# Patient Record
Sex: Male | Born: 1965 | Race: Black or African American | Hispanic: No | Marital: Single | Smoking: Current every day smoker
Health system: Southern US, Community
[De-identification: ages and names within clinical notes are randomized; demographics above are authoritative.]

## PROBLEM LIST (undated history)

## (undated) DIAGNOSIS — I10 Essential (primary) hypertension: Secondary | ICD-10-CM

---

## 1999-09-12 ENCOUNTER — Encounter: Payer: Self-pay | Admitting: Emergency Medicine

## 1999-09-12 ENCOUNTER — Emergency Department (HOSPITAL_COMMUNITY): Admission: EM | Admit: 1999-09-12 | Discharge: 1999-09-12 | Payer: Self-pay | Admitting: Emergency Medicine

## 2001-02-20 ENCOUNTER — Inpatient Hospital Stay (HOSPITAL_COMMUNITY): Admission: EM | Admit: 2001-02-20 | Discharge: 2001-02-23 | Payer: Self-pay | Admitting: Emergency Medicine

## 2001-02-21 ENCOUNTER — Encounter: Payer: Self-pay | Admitting: Internal Medicine

## 2001-02-22 ENCOUNTER — Encounter: Payer: Self-pay | Admitting: Internal Medicine

## 2001-06-28 ENCOUNTER — Emergency Department (HOSPITAL_COMMUNITY): Admission: EM | Admit: 2001-06-28 | Discharge: 2001-06-28 | Payer: Self-pay | Admitting: Emergency Medicine

## 2001-07-19 ENCOUNTER — Emergency Department (HOSPITAL_COMMUNITY): Admission: EM | Admit: 2001-07-19 | Discharge: 2001-07-19 | Payer: Self-pay | Admitting: Emergency Medicine

## 2001-11-12 ENCOUNTER — Emergency Department (HOSPITAL_COMMUNITY): Admission: EM | Admit: 2001-11-12 | Discharge: 2001-11-12 | Payer: Self-pay | Admitting: Emergency Medicine

## 2002-03-06 ENCOUNTER — Encounter: Payer: Self-pay | Admitting: Emergency Medicine

## 2002-03-06 ENCOUNTER — Emergency Department (HOSPITAL_COMMUNITY): Admission: EM | Admit: 2002-03-06 | Discharge: 2002-03-06 | Payer: Self-pay | Admitting: Emergency Medicine

## 2002-03-07 ENCOUNTER — Emergency Department (HOSPITAL_COMMUNITY): Admission: EM | Admit: 2002-03-07 | Discharge: 2002-03-07 | Payer: Self-pay | Admitting: Emergency Medicine

## 2002-05-22 ENCOUNTER — Encounter: Admission: RE | Admit: 2002-05-22 | Discharge: 2002-08-20 | Payer: Self-pay | Admitting: Family Medicine

## 2002-09-23 ENCOUNTER — Emergency Department (HOSPITAL_COMMUNITY): Admission: EM | Admit: 2002-09-23 | Discharge: 2002-09-23 | Payer: Self-pay | Admitting: Emergency Medicine

## 2002-10-17 ENCOUNTER — Emergency Department (HOSPITAL_COMMUNITY): Admission: EM | Admit: 2002-10-17 | Discharge: 2002-10-17 | Payer: Self-pay | Admitting: Emergency Medicine

## 2003-03-03 ENCOUNTER — Emergency Department (HOSPITAL_COMMUNITY): Admission: EM | Admit: 2003-03-03 | Discharge: 2003-03-04 | Payer: Self-pay | Admitting: Emergency Medicine

## 2003-09-22 ENCOUNTER — Emergency Department (HOSPITAL_COMMUNITY): Admission: EM | Admit: 2003-09-22 | Discharge: 2003-09-23 | Payer: Self-pay | Admitting: Emergency Medicine

## 2003-10-09 ENCOUNTER — Encounter: Admission: RE | Admit: 2003-10-09 | Discharge: 2004-01-07 | Payer: Self-pay | Admitting: Family Medicine

## 2004-11-02 ENCOUNTER — Emergency Department (HOSPITAL_COMMUNITY): Admission: EM | Admit: 2004-11-02 | Discharge: 2004-11-02 | Payer: Self-pay | Admitting: Emergency Medicine
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
## 2005-10-12 ENCOUNTER — Emergency Department (HOSPITAL_COMMUNITY): Admission: EM | Admit: 2005-10-12 | Discharge: 2005-10-12 | Payer: Self-pay | Admitting: Emergency Medicine

## 2006-04-07 ENCOUNTER — Emergency Department (HOSPITAL_COMMUNITY): Admission: EM | Admit: 2006-04-07 | Discharge: 2006-04-07 | Payer: Self-pay | Admitting: Emergency Medicine

## 2006-05-13 ENCOUNTER — Emergency Department (HOSPITAL_COMMUNITY): Admission: EM | Admit: 2006-05-13 | Discharge: 2006-05-13 | Payer: Self-pay | Admitting: Emergency Medicine

## 2006-05-15 ENCOUNTER — Emergency Department (HOSPITAL_COMMUNITY): Admission: EM | Admit: 2006-05-15 | Discharge: 2006-05-15 | Payer: Self-pay | Admitting: Emergency Medicine

## 2006-05-17 ENCOUNTER — Emergency Department (HOSPITAL_COMMUNITY): Admission: EM | Admit: 2006-05-17 | Discharge: 2006-05-17 | Payer: Self-pay | Admitting: Family Medicine

## 2007-01-18 ENCOUNTER — Emergency Department (HOSPITAL_COMMUNITY): Admission: EM | Admit: 2007-01-18 | Discharge: 2007-01-18 | Payer: Self-pay | Admitting: Emergency Medicine

## 2007-02-19 ENCOUNTER — Emergency Department (HOSPITAL_COMMUNITY): Admission: EM | Admit: 2007-02-19 | Discharge: 2007-02-19 | Payer: Self-pay | Admitting: Emergency Medicine

## 2007-12-23 ENCOUNTER — Emergency Department (HOSPITAL_COMMUNITY): Admission: EM | Admit: 2007-12-23 | Discharge: 2007-12-23 | Payer: Self-pay | Admitting: Emergency Medicine

## 2008-03-18 ENCOUNTER — Emergency Department (HOSPITAL_COMMUNITY): Admission: EM | Admit: 2008-03-18 | Discharge: 2008-03-18 | Payer: Self-pay | Admitting: Family Medicine

## 2009-02-18 ENCOUNTER — Emergency Department (HOSPITAL_COMMUNITY): Admission: EM | Admit: 2009-02-18 | Discharge: 2009-02-18 | Payer: Self-pay | Admitting: Emergency Medicine

## 2010-06-09 ENCOUNTER — Inpatient Hospital Stay (INDEPENDENT_AMBULATORY_CARE_PROVIDER_SITE_OTHER)
Admission: RE | Admit: 2010-06-09 | Discharge: 2010-06-09 | Disposition: A | Discharge status: Home / Self Care | Payer: Self-pay | Source: Ambulatory Visit | Attending: Family Medicine | Admitting: Family Medicine

## 2010-06-09 DIAGNOSIS — L989 Disorder of the skin and subcutaneous tissue, unspecified: Secondary | ICD-10-CM

## 2010-07-22 NOTE — Discharge Summary (Signed)
[place]  Patient:    Nathan Benson, Nathan Benson Visit Number: [id number] MRN: [id number]          Service Type: MED Location: [id number] Attending Physician:  Phifer, Trinna Post Dictated by:   Ladell Pier, M.D. Admit Date:  02/20/2001 Discharge Date: 02/23/2001                             Discharge Summary  DISCHARGE DIAGNOSES: 1. Cocaine ingestion. 2. Metabolic acidosis, plus respiratory acidosis. 3. Seizure. 4. Cardiac arrhythmia.  DISCHARGE MEDICATIONS:  None.  FOLLOWUP APPOINTMENT:  The patient was told to call the outpatient clinic for appointment.  CONSULTANTS:  Critical care medicine.  PROCEDURES:  Intubated on the 18th, extubated on the 19th.  HISTORY/CHIEF COMPLAINT:  Cocaine ingestion per ER staff and police officer. The patient was initially taken into custody by police officers at 9 p.m. yesterday, February 20, 2001, for possession of controlled substances.  The patient apparently swallowed a bag of cocaine, was foaming at the mouth in the police car, was brought to the emergency room by the police.  He was alert and oriented at that time, stating that he wanted to get his stomach pumped.  He began feeling ill, per nursing staff.  The patient tried gagging himself for self-induced emesis.  At 2300, he began having status epilepticus.  Anesthesia was notified and the patient was sedated and intubated.  He went into ventricular tachycardia shock x1, with 360 joules, and converted to sinus. He is currently sedated and on the ventilator, loaded with Dilantin, and was given succinylcholine and lidocaine during induction.  On seeing the patient, blood pressure was 60-90/20s, heart rate 110 to 120, blood gas, at the time, pH was 6.75, bicarb 10.  PAST MEDICAL HISTORY:  None.  FAMILY HISTORY/SOCIAL HISTORY:  Unknown.  ALLERGIES:  No known drug allergies.  MEDICATIONS:  None.  PHYSICAL EXAMINATION:  VITAL SIGNS:  When seeing  the patient in the emergency room, temperature 98.7, blood pressure low at 70/20, currently it was 120/30s, heart rate 110 to 120s on the vent.  He was sedated and on the vent.  HEENT:  Pupils are equal, round, and reactive to light, 6 mm bilaterally. Oropharynx, blood-tinged.  RESPIRATORY:  Clear in the anterior fields.  CARDIOVASCULAR:  He was tachycardic.  ABDOMEN:  Positive bowel sounds.  NEURO:  Toes were downgoing.  EXTREMITIES:  Lower extremity reflexes were about 1+.  HOSPITAL COURSE: #1 - COCAINE OVERDOSE:  Poison control was contacted and stated that cocaine clears the system in 8-12 hours.  The main concern was the seizure, the decreased blood pressure, and the respiratory status.  Currently, the patient was hemodynamically improved on D5 half-normal saline and bicarb with potassium.  He was loaded with Dilantin for status and he was kept sedated on the vent, due to respiratory depression.  The patient did well on the vent, woke up and was able to follow commands.  The next day, head CT was negative for anything acute.  The patient recovered full mental status and was tried on a spontaneous breathing trial on the vent and was successful, was successfully extubated and did well post extubation without any complications.  #2 - METABOLIC ACIDOSIS/RESPIRATORY ACIDOSIS:  On admission, pH was 6.75.  The patient was infused two amps of bicarb and placed on a bicarb drip.  Blood gas went to pH 7.55, PCO2 27, with PO2 of 575.  Bicarb  of 24 and that was on the vent.  During the course of his stay, his metabolic acidosis and respiratory acidosis corrected.  #3 - SEIZURE:  In the emergency department, he was loaded with Dilantin.  He had a head CT which showed no hemorrhage.  Head CT was negative.  His _____, he had no recurrence of seizure after the episode in the emergency department. The seizure was most likely secondary to the cocaine.  He required no more treatments for  seizure.  #4 - CARDIAC ARRHYTHMIA:  The patient went into ventricular tachycardia requiring defibrillator and was given lidocaine and put on a lidocaine drip. Cardiac enzymes were done which were negative, except for elevated CKs, possible mild rhabdomyolysis from the seizure and from the shock.  During his hospital course, his CK levels increased all the way up to 13,315 with his MB 143, relative index of 1.1, and his troponin 1.19.  The elevation in enzymes is most likely secondary to the patient requiring defibrillation for his ventricular tachycardia and probably some rhabdomyolysis.  His enzymes started trending down during his hospitalization.  #5 - ELEVATED LIVER ENZYMES:  The patients liver enzymes were also elevated during his hospitalization.  That was most likely secondary to his hypertension, probably from shocked liver.  His liver enzymes also trended down and the patient was to follow up outpatient to have his liver enzymes rechecked.  #6 - HYPOKALEMIA:  The patient was treated with runs of potassium for his hypokalemia, and that resolved.  Overall, the patient was back to his baseline on discharge and was told to follow up with the outpatient clinic to get all of his laboratory work checked within a week to see if everything had gone back to baseline.  LABORATORIES:  On discharge:  Head CT negative.  Chest x-ray negative. Abdominal CT showed normal bowel gas pattern, no foreign body identified. Chest x-ray showed no active disease.  Comprehensive metabolic panel:  Sodium 141, potassium 3.6, chloride 108, CO2 30, glucose 115, BUN 5, creatinine 1.2, calcium 8.9.  Total protein 6.6, albumin 3.4, AST 296, ALT 146, alkaline phosphatase 53, total bilirubin 0.7.  Cardiac enzymes were trended down, 15,509, CK MB 64.7, troponin 0.52.  Maximum CK was 20,287, MB 153.7, and troponin 0.72.  UA was negative.  Urine drug screen positive for cocaine. Dictated by:   Ladell Pier,  M.D. Attending Physician:  Phifer, Trinna Post DD:  05/24/01  TD:  05/27/01 Job: [id number]

## 2011-03-17 ENCOUNTER — Encounter (HOSPITAL_COMMUNITY): Payer: Self-pay

## 2011-03-17 ENCOUNTER — Emergency Department (INDEPENDENT_AMBULATORY_CARE_PROVIDER_SITE_OTHER)
Admission: EM | Admit: 2011-03-17 | Discharge: 2011-03-17 | Disposition: A | Discharge status: Home / Self Care | Payer: Self-pay | Source: Home / Self Care | Attending: Family Medicine | Admitting: Family Medicine

## 2011-03-17 DIAGNOSIS — L230 Allergic contact dermatitis due to metals: Secondary | ICD-10-CM

## 2011-03-17 MED ORDER — TRIAMCINOLONE ACETONIDE 0.1 % EX OINT: TOPICAL_OINTMENT | Freq: Two times a day (BID) | CUTANEOUS | Status: AC

## 2011-03-17 NOTE — ED Notes (Signed)
C/o his belt makes him break out in rash; also has recurrence of rash on shoulder x 2 yr

## 2011-04-18 NOTE — ED Provider Notes (Addendum)
History     CSN: [id number]  Arrival date & time 03/17/11  1338   First MD Initiated Contact with Patient 03/17/11 1355      Chief Complaint  Patient presents with  . Rash    (Consider location/radiation/quality/duration/timing/severity/associated sxs/prior treatment) Patient is a 46 y.o. male presenting with rash. The history is provided by the patient.  Rash  This is a recurrent problem. The problem has not changed since onset.The problem is associated with nothing. There has been no fever. The rash is present on the abdomen. The pain is mild. Associated symptoms include itching.    History reviewed. No pertinent past medical history.  History reviewed. No pertinent past surgical history.  History reviewed. No pertinent family history.  History  Substance Use Topics  . Smoking status: Current Everyday Smoker -- 0.5 packs/day  . Smokeless tobacco: Not on file  . Alcohol Use: No      Review of Systems  Constitutional: Negative.   HENT: Negative.   Eyes: Negative.   Respiratory: Negative.   Cardiovascular: Negative.   Gastrointestinal: Negative.   Genitourinary: Negative.   Musculoskeletal: Negative.   Skin: Positive for itching and rash.  Neurological: Negative.     Allergies  Review of patient's allergies indicates no known allergies.  Home Medications   Current Outpatient Rx  Name Route Sig Dispense Refill  . TRIAMCINOLONE ACETONIDE 0.1 % EX OINT Topical Apply topically 2 (two) times daily. 80 g 1    BP 158/89  Pulse 102  Temp(Src) 99.4 F (37.4 C) (Oral)  Resp 18  SpO2 97%  Physical Exam  Nursing note and vitals reviewed. Constitutional: He is oriented to person, place, and time. He appears well-developed and well-nourished.  HENT:  Head: Normocephalic and atraumatic.  Eyes: EOM are normal.  Neck: Normal range of motion.  Pulmonary/Chest: Effort normal.  Musculoskeletal: Normal range of motion.  Neurological: He is alert and oriented to  person, place, and time.  Skin: Skin is warm and dry. Rash noted. Rash is papular.     Psychiatric: His behavior is normal.    ED Course  Procedures (including critical care time)  Labs Reviewed - No data to display No results found.   1. 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

## 2012-02-23 ENCOUNTER — Emergency Department (INDEPENDENT_AMBULATORY_CARE_PROVIDER_SITE_OTHER)
Admission: EM | Admit: 2012-02-23 | Discharge: 2012-02-23 | Disposition: A | Discharge status: Home / Self Care | Payer: Self-pay | Source: Home / Self Care | Attending: Family Medicine | Admitting: Family Medicine

## 2012-02-23 ENCOUNTER — Encounter (HOSPITAL_COMMUNITY): Payer: Self-pay | Admitting: Emergency Medicine

## 2012-02-23 DIAGNOSIS — L230 Allergic contact dermatitis due to metals: Secondary | ICD-10-CM

## 2012-02-23 MED ORDER — FLUTICASONE PROPIONATE 0.005 % EX OINT: TOPICAL_OINTMENT | Freq: Two times a day (BID) | CUTANEOUS | Status: DC

## 2012-02-23 NOTE — ED Provider Notes (Signed)
History     CSN: [id number]  Arrival date & time 02/23/12  1145   First MD Initiated Contact with Patient 02/23/12 1202      Chief Complaint  Patient presents with  . Rash    (Consider location/radiation/quality/duration/timing/severity/associated sxs/prior treatment) Patient is a 46 y.o. male presenting with rash. The history is provided by the patient.  Rash  This is a chronic problem. The current episode started more than 1 week ago (seen 1 yr ago , given cr but not resolved.). The problem has not changed since onset.The problem is associated with an unknown factor. There has been no fever.    History reviewed. No pertinent past medical history.  History reviewed. No pertinent past surgical history.  History reviewed. No pertinent family history.  History  Substance Use Topics  . Smoking status: Current Every Day Smoker -- 0.5 packs/day  . Smokeless tobacco: Not on file  . Alcohol Use: No      Review of Systems  Constitutional: Negative.   Skin: Positive for rash.    Allergies  Review of patient's allergies indicates no known allergies.  Home Medications   Current Outpatient Rx  Name  Route  Sig  Dispense  Refill  . FLUTICASONE PROPIONATE 0.005 % EX OINT   Topical   Apply topically 2 (two) times daily. To area of rash   30 g   1   . TRIAMCINOLONE ACETONIDE 0.1 % EX OINT   Topical   Apply topically 2 (two) times daily.   80 g   1     BP 151/92  Pulse 97  Temp 98.5 F (36.9 C) (Oral)  Resp 16  SpO2 100%  Physical Exam  Nursing note and vitals reviewed. Constitutional: He is oriented to person, place, and time. He appears well-developed and well-nourished.  Neurological: He is alert and oriented to person, place, and time.  Skin: Skin is warm and dry. Rash noted.       Hyperpigmented infraumbilical dermatitis , nontender, no infection, dry.  Psychiatric: He has a normal mood and affect.    ED Course  Procedures (including critical care  time)  Labs Reviewed - No data to display No results found.   1. Contact dermatitis due to metal       MDM          Linna Hoff, MD 02/23/12 [phone]

## 2012-02-23 NOTE — ED Notes (Addendum)
Reports skin rash on abdominal area.  Patient states he feels like his belt is breaking him out.  Reports using triam. Cream but ran out.  Cream does help.

## 2012-10-31 ENCOUNTER — Emergency Department (INDEPENDENT_AMBULATORY_CARE_PROVIDER_SITE_OTHER)
Admission: EM | Admit: 2012-10-31 | Discharge: 2012-10-31 | Disposition: A | Discharge status: Home / Self Care | Payer: Self-pay | Source: Home / Self Care | Attending: Family Medicine | Admitting: Family Medicine

## 2012-10-31 ENCOUNTER — Encounter (HOSPITAL_COMMUNITY): Payer: Self-pay | Admitting: *Deleted

## 2012-10-31 DIAGNOSIS — I10 Essential (primary) hypertension: Secondary | ICD-10-CM

## 2012-10-31 DIAGNOSIS — L259 Unspecified contact dermatitis, unspecified cause: Secondary | ICD-10-CM

## 2012-10-31 LAB — POCT I-STAT, CHEM 8
Calcium, Ion: 1.22 mmol/L (ref 1.12–1.23)
Glucose, Bld: 92 mg/dL (ref 70–99)
HCT: 49 % (ref 39.0–52.0)
Hemoglobin: 16.7 g/dL (ref 13.0–17.0)

## 2012-10-31 MED ORDER — TRIAMCINOLONE ACETONIDE 0.1 % EX CREA: TOPICAL_CREAM | Freq: Two times a day (BID) | CUTANEOUS | Status: DC

## 2012-10-31 MED ORDER — ATENOLOL-CHLORTHALIDONE 50-25 MG PO TABS: 1.0000 | ORAL_TABLET | Freq: Every day | ORAL | Status: DC

## 2012-10-31 MED ORDER — TRIAMCINOLONE ACETONIDE 40 MG/ML IJ SUSP
INTRAMUSCULAR | Status: AC
  Filled 2012-10-31: qty 1

## 2012-10-31 MED ORDER — METHYLPREDNISOLONE ACETATE 80 MG/ML IJ SUSP
INTRAMUSCULAR | Status: AC
  Filled 2012-10-31: qty 1

## 2012-10-31 MED ORDER — TRIAMCINOLONE ACETONIDE 40 MG/ML IJ SUSP
40.0000 mg | Freq: Once | INTRAMUSCULAR | Status: AC
  Administered 2012-10-31: 40 mg via INTRAMUSCULAR

## 2012-10-31 MED ORDER — METHYLPREDNISOLONE ACETATE 40 MG/ML IJ SUSP
80.0000 mg | Freq: Once | INTRAMUSCULAR | Status: AC
  Administered 2012-10-31: 80 mg via INTRAMUSCULAR

## 2012-10-31 NOTE — Discharge Instructions (Signed)
Take medicine as prescribed and see doctor in 2 weeks for recheck.

## 2012-10-31 NOTE — ED Notes (Signed)
Pt c/o rash covering upper extremities, chest, and back x 1 week. Pt stated he applied calamine lotion with no relief. 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 Jan Ranson, SMA

## 2012-10-31 NOTE — ED Provider Notes (Signed)
CSN: [id number]     Arrival date & time 10/31/12  1518 History   First MD Initiated Contact with Patient 10/31/12 1616     Chief Complaint  Patient presents with  . Rash   (Consider location/radiation/quality/duration/timing/severity/associated sxs/prior Treatment) Patient is a 47 y.o. male presenting with rash. The history is provided by the patient.  Rash Pain severity:  No pain Onset quality:  Gradual Duration:  1 week Progression:  Unchanged Chronicity:  New Context: not sick contacts and not suspicious food intake   Context comment:  Cuts grass on job Relieved by:  Nothing Associated symptoms: no chest pain     History reviewed. No pertinent past medical history. History reviewed. No pertinent past surgical history. No family history on file. History  Substance Use Topics  . Smoking status: Current Every Day Smoker -- 0.50 packs/day  . Smokeless tobacco: Not on file  . Alcohol Use: No    Review of Systems  Constitutional: Negative.   Respiratory: Negative.   Cardiovascular: Negative for chest pain.  Skin: Positive for rash.    Allergies  Review of patient's allergies indicates no known allergies.  Home Medications   Current Outpatient Rx  Name  Route  Sig  Dispense  Refill  . atenolol-chlorthalidone (TENORETIC) 50-25 MG per tablet   Oral   Take 1 tablet by mouth daily.   30 tablet   1   . fluticasone (CUTIVATE) 0.005 % ointment   Topical   Apply topically 2 (two) times daily. To area of rash   30 g   1    BP 170/121  Pulse 107  Temp(Src) 98.6 F (37 C) (Oral)  Resp 20  SpO2 100% Physical Exam  Nursing note and vitals reviewed. Constitutional: He is oriented to person, place, and time. He appears well-developed and well-nourished.  Neck: Normal range of motion. Neck supple.  Cardiovascular: Normal heart sounds and intact distal pulses.  Tachycardia present.   Pulmonary/Chest: Effort normal and breath sounds normal.  Neurological: He is alert  and oriented to person, place, and time.  Skin: Skin is warm and dry. Rash noted.  Diffuse papular rash, sparing palms and soles.    ED Course  Procedures (including critical care time) Labs Review Labs Reviewed  POCT I-STAT, CHEM 8 - Abnormal; Notable for the following:    Creatinine, Ser 1.40 (*)    All other components within normal limits   Imaging Review No results found.  MDM   1. Hypertension   2. Contact dermatitis        Linna Hoff, MD 10/31/12 [phone]

## 2016-01-05 ENCOUNTER — Other Ambulatory Visit: Payer: Self-pay | Admitting: Occupational Medicine

## 2016-01-05 ENCOUNTER — Ambulatory Visit: Payer: Self-pay

## 2016-01-05 DIAGNOSIS — M79644 Pain in right finger(s): Secondary | ICD-10-CM

## 2016-02-03 DIAGNOSIS — M20011 Mallet finger of right finger(s): Secondary | ICD-10-CM | POA: Insufficient documentation

## 2016-03-17 DIAGNOSIS — M25649 Stiffness of unspecified hand, not elsewhere classified: Secondary | ICD-10-CM | POA: Insufficient documentation

## 2018-03-21 ENCOUNTER — Emergency Department (HOSPITAL_COMMUNITY)
Admission: EM | Admit: 2018-03-21 | Discharge: 2018-03-22 | Disposition: A | Discharge status: Home / Self Care | Payer: 59 | Attending: Emergency Medicine | Admitting: Emergency Medicine

## 2018-03-21 ENCOUNTER — Encounter (HOSPITAL_COMMUNITY): Payer: Self-pay

## 2018-03-21 ENCOUNTER — Other Ambulatory Visit: Payer: Self-pay

## 2018-03-21 DIAGNOSIS — W2209XA Striking against other stationary object, initial encounter: Secondary | ICD-10-CM | POA: Diagnosis not present

## 2018-03-21 DIAGNOSIS — S0501XA Injury of conjunctiva and corneal abrasion without foreign body, right eye, initial encounter: Secondary | ICD-10-CM | POA: Insufficient documentation

## 2018-03-21 DIAGNOSIS — F17210 Nicotine dependence, cigarettes, uncomplicated: Secondary | ICD-10-CM | POA: Insufficient documentation

## 2018-03-21 DIAGNOSIS — Y929 Unspecified place or not applicable: Secondary | ICD-10-CM | POA: Diagnosis not present

## 2018-03-21 DIAGNOSIS — Y939 Activity, unspecified: Secondary | ICD-10-CM | POA: Insufficient documentation

## 2018-03-21 DIAGNOSIS — I10 Essential (primary) hypertension: Secondary | ICD-10-CM | POA: Diagnosis not present

## 2018-03-21 DIAGNOSIS — Y999 Unspecified external cause status: Secondary | ICD-10-CM | POA: Insufficient documentation

## 2018-03-21 DIAGNOSIS — Z79899 Other long term (current) drug therapy: Secondary | ICD-10-CM | POA: Diagnosis not present

## 2018-03-21 NOTE — ED Triage Notes (Signed)
Pt here for complaints of a splinter in his Right eye.  Pt states he was at work and felt something in his eye.  Went home and thought it was fine, now pain got worse.  Eye red on exam.

## 2018-03-22 MED ORDER — TETRACAINE HCL 0.5 % OP SOLN
2.0000 [drp] | Freq: Once | OPHTHALMIC | Status: AC
  Administered 2018-03-22: 2 [drp] via OPHTHALMIC
  Filled 2018-03-22: qty 4

## 2018-03-22 MED ORDER — ATENOLOL-CHLORTHALIDONE 50-25 MG PO TABS: 1.0000 | ORAL_TABLET | Freq: Every day | ORAL | 0 refills | Status: DC

## 2018-03-22 MED ORDER — FLUORESCEIN SODIUM 1 MG OP STRP
1.0000 | ORAL_STRIP | Freq: Once | OPHTHALMIC | Status: DC
  Filled 2018-03-22: qty 1

## 2018-03-22 MED ORDER — ERYTHROMYCIN 5 MG/GM OP OINT: TOPICAL_OINTMENT | OPHTHALMIC | 1 refills | Status: DC

## 2018-03-22 MED ORDER — ERYTHROMYCIN 5 MG/GM OP OINT
1.0000 "application " | TOPICAL_OINTMENT | Freq: Once | OPHTHALMIC | Status: AC
  Administered 2018-03-22: 1 via OPHTHALMIC
  Filled 2018-03-22: qty 3.5

## 2018-03-22 MED ORDER — OXYCODONE-ACETAMINOPHEN 5-325 MG PO TABS: 1.0000 | ORAL_TABLET | ORAL | 0 refills | Status: DC | PRN
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
## 2018-03-22 NOTE — ED Notes (Addendum)
Pt has approached this EMT multiple times in the lobby about the pain in his eye stating "it feels like there's a hole burning in my eye." This EMT attempted on multiple occasions to examine eye but pt refused to let staff touch or get near eye area. Noted to be red and watery but nothing else able to be observed.   Pt noted to he unsteady on his feet and encouraged multiple times to sit down for his own safety. Arlys John, RN made aware that pt was non-complaint.

## 2019-05-26 ENCOUNTER — Encounter (HOSPITAL_COMMUNITY): Payer: Self-pay | Admitting: Emergency Medicine

## 2019-05-26 ENCOUNTER — Other Ambulatory Visit: Payer: Self-pay

## 2019-05-26 ENCOUNTER — Ambulatory Visit (HOSPITAL_COMMUNITY)
Admission: EM | Admit: 2019-05-26 | Discharge: 2019-05-26 | Disposition: A | Discharge status: Home / Self Care | Payer: 59 | Attending: Family Medicine | Admitting: Family Medicine

## 2019-05-26 DIAGNOSIS — I10 Essential (primary) hypertension: Secondary | ICD-10-CM

## 2019-05-26 DIAGNOSIS — S39012A Strain of muscle, fascia and tendon of lower back, initial encounter: Secondary | ICD-10-CM | POA: Diagnosis not present

## 2019-05-26 HISTORY — DX: Essential (primary) hypertension: I10

## 2019-05-26 MED ORDER — CYCLOBENZAPRINE HCL 10 MG PO TABS: ORAL_TABLET | ORAL | 0 refills | Status: DC

## 2019-05-26 MED ORDER — ATENOLOL-CHLORTHALIDONE 50-25 MG PO TABS: 1.0000 | ORAL_TABLET | Freq: Every day | ORAL | 2 refills | Status: DC

## 2019-05-26 MED ORDER — DICLOFENAC SODIUM 75 MG PO TBEC: 75.0000 mg | DELAYED_RELEASE_TABLET | Freq: Two times a day (BID) | ORAL | 0 refills | Status: DC
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
## 2019-05-31 ENCOUNTER — Emergency Department (HOSPITAL_COMMUNITY)
Admission: EM | Admit: 2019-05-31 | Discharge: 2019-05-31 | Disposition: A | Discharge status: Home / Self Care | Payer: 59 | Attending: Emergency Medicine | Admitting: Emergency Medicine

## 2019-05-31 ENCOUNTER — Other Ambulatory Visit: Payer: Self-pay

## 2019-05-31 ENCOUNTER — Emergency Department (HOSPITAL_COMMUNITY): Payer: 59

## 2019-05-31 ENCOUNTER — Encounter (HOSPITAL_COMMUNITY): Payer: Self-pay | Admitting: Emergency Medicine

## 2019-05-31 DIAGNOSIS — I10 Essential (primary) hypertension: Secondary | ICD-10-CM | POA: Diagnosis not present

## 2019-05-31 DIAGNOSIS — Z79899 Other long term (current) drug therapy: Secondary | ICD-10-CM | POA: Diagnosis not present

## 2019-05-31 DIAGNOSIS — S46811A Strain of other muscles, fascia and tendons at shoulder and upper arm level, right arm, initial encounter: Secondary | ICD-10-CM

## 2019-05-31 DIAGNOSIS — Y999 Unspecified external cause status: Secondary | ICD-10-CM | POA: Insufficient documentation

## 2019-05-31 DIAGNOSIS — S63696A Other sprain of right little finger, initial encounter: Secondary | ICD-10-CM

## 2019-05-31 DIAGNOSIS — Y92410 Unspecified street and highway as the place of occurrence of the external cause: Secondary | ICD-10-CM | POA: Diagnosis not present

## 2019-05-31 DIAGNOSIS — M545 Low back pain, unspecified: Secondary | ICD-10-CM

## 2019-05-31 DIAGNOSIS — F17200 Nicotine dependence, unspecified, uncomplicated: Secondary | ICD-10-CM | POA: Insufficient documentation

## 2019-05-31 DIAGNOSIS — M549 Dorsalgia, unspecified: Secondary | ICD-10-CM | POA: Diagnosis not present

## 2019-05-31 DIAGNOSIS — S4991XA Unspecified injury of right shoulder and upper arm, initial encounter: Secondary | ICD-10-CM | POA: Diagnosis present

## 2019-05-31 DIAGNOSIS — Y9389 Activity, other specified: Secondary | ICD-10-CM | POA: Diagnosis not present
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
## 2019-05-31 NOTE — Discharge Instructions (Addendum)
Wear finger splint for to help with pain in the little finger Continue Ibuprofen as needed for pain for the next week. Take this medicine with food. Take muscle relaxer at bedtime to help you sleep. This medicine makes you drowsy so do not take before driving or work Use a heating pad for sore muscles - use for 20 minutes several times a day Try gentle range of motion exercises for the back, neck, and shoulder Return for worsening symptoms

## 2019-08-20 ENCOUNTER — Ambulatory Visit (HOSPITAL_COMMUNITY)
Admission: EM | Admit: 2019-08-20 | Discharge: 2019-08-20 | Disposition: A | Discharge status: Home / Self Care | Payer: 59 | Attending: Urgent Care | Admitting: Urgent Care

## 2019-08-20 ENCOUNTER — Other Ambulatory Visit: Payer: Self-pay

## 2019-08-20 ENCOUNTER — Encounter (HOSPITAL_COMMUNITY): Payer: Self-pay | Admitting: Emergency Medicine

## 2019-08-20 DIAGNOSIS — Z760 Encounter for issue of repeat prescription: Secondary | ICD-10-CM | POA: Diagnosis not present

## 2019-08-20 DIAGNOSIS — I10 Essential (primary) hypertension: Secondary | ICD-10-CM

## 2019-08-20 MED ORDER — ATENOLOL-CHLORTHALIDONE 50-25 MG PO TABS: 1.0000 | ORAL_TABLET | Freq: Every day | ORAL | 0 refills | Status: DC

## 2019-08-20 NOTE — ED Provider Notes (Signed)
  [facility]   MRN: [id number] DOB: 10-20-1965  Subjective:   Nathan Benson is a 54 y.o. male presenting for medication refill of his blood pressure medicine atenolol chlorthalidone.  Patient states that he is planning on establishing care with a new PCP.  Denies headache, confusion, dizziness, chest pain, hematuria.  No current facility-administered medications for this encounter.  Current Outpatient Medications:  .  atenolol-chlorthalidone (TENORETIC) 50-25 MG tablet, Take 1 tablet by mouth daily., Disp: 30 tablet, Rfl: 2 .  cyclobenzaprine (FLEXERIL) 10 MG tablet, Take 1 tablet by mouth 3 times daily as needed for muscle spasm. Warning: May cause drowsiness., Disp: 21 tablet, Rfl: 0 .  diclofenac (VOLTAREN) 75 MG EC tablet, Take 1 tablet (75 mg total) by mouth 2 (two) times daily., Disp: 14 tablet, Rfl: 0   No Known Allergies  Past Medical History:  Diagnosis Date  . Hypertension      History reviewed. No pertinent surgical history.  History reviewed. No pertinent family history.  Social History   Tobacco Use  . Smoking status: Current Every Day Smoker    Packs/day: 0.50  . Smokeless tobacco: Never Used  Vaping Use  . Vaping Use: Never used  Substance Use Topics  . Alcohol use: No  . Drug use: No    ROS   Objective:   Vitals: BP (!) 139/94 (BP Location: Left Arm)   Pulse 84   Temp 98 F (36.7 C)   Resp 20   SpO2 97%   Physical Exam Constitutional:      General: He is not in acute distress.    Appearance: Normal appearance. He is well-developed. He is not ill-appearing, toxic-appearing or diaphoretic.  HENT:     Head: Normocephalic and atraumatic.     Right Ear: External ear normal.     Left Ear: External ear normal.     Nose: Nose normal.     Mouth/Throat:     Mouth: Mucous membranes are moist.     Pharynx: Oropharynx is clear.  Eyes:     General: No scleral icterus.    Extraocular Movements: Extraocular movements intact.     Pupils:  Pupils are equal, round, and reactive to light.  Cardiovascular:     Rate and Rhythm: Normal rate and regular rhythm.     Heart sounds: Normal heart sounds. No murmur heard.  No friction rub. No gallop.   Pulmonary:     Effort: Pulmonary effort is normal. No respiratory distress.     Breath sounds: Normal breath sounds. No stridor. No wheezing, rhonchi or rales.  Neurological:     Mental Status: He is alert and oriented to person, place, and time.  Psychiatric:        Mood and Affect: Mood normal.        Behavior: Behavior normal.        Thought Content: Thought content normal.        Judgment: Judgment normal.      Assessment and Plan :   PDMP not reviewed this encounter.  1. Encounter for medication refill   2. Essential hypertension     Patient states he will see his new PCP in September.  Provided 90-day supply. Counseled patient on potential for adverse effects with medications prescribed/recommended today, ER and return-to-clinic precautions discussed, patient verbalized understanding.    Wallis Bamberg, [place] 08/20/19 1919

## 2019-08-20 NOTE — ED Triage Notes (Signed)
Patient ran out of blood pressure medicine yesterday.  Says he does not pcp.  Patient says last script was written here.  Patient denies pain

## 2019-08-20 NOTE — Discharge Instructions (Signed)

## 2019-11-08 ENCOUNTER — Ambulatory Visit (HOSPITAL_COMMUNITY)
Admission: EM | Admit: 2019-11-08 | Discharge: 2019-11-08 | Disposition: A | Discharge status: Home / Self Care | Payer: 59 | Attending: Family Medicine | Admitting: Family Medicine

## 2019-11-08 ENCOUNTER — Other Ambulatory Visit: Payer: Self-pay

## 2019-11-08 ENCOUNTER — Encounter (HOSPITAL_COMMUNITY): Payer: Self-pay | Admitting: Gynecology

## 2019-11-08 DIAGNOSIS — Z760 Encounter for issue of repeat prescription: Secondary | ICD-10-CM | POA: Diagnosis not present

## 2019-11-08 DIAGNOSIS — I10 Essential (primary) hypertension: Secondary | ICD-10-CM | POA: Diagnosis not present

## 2019-11-08 MED ORDER — ATENOLOL-CHLORTHALIDONE 50-25 MG PO TABS: 1.0000 | ORAL_TABLET | Freq: Every day | ORAL | 0 refills | Status: DC

## 2019-11-08 NOTE — ED Triage Notes (Signed)
Patient need his Atenolol medication refill.

## 2019-11-08 NOTE — ED Provider Notes (Signed)
[facility]    CSN: [id number] Arrival date & time: 11/08/19  1057      History   Chief Complaint Chief Complaint  Patient presents with   Medication Refill    HPI Nathan Benson is a 54 y.o. male.   Reports that he needs a refill of his blood pressure medications. Takes atenolol-chlorthalidone 50-25mg  daily. Reports that he does not have primary care. Declines headaches, sore throat, nausea, vomiting, diarrhea, chest pain, rash, fever, other symptoms.   ROS per HPI     Past Medical History:  Diagnosis Date   Hypertension     There are no problems to display for this patient.   History reviewed. No pertinent surgical history.     Home Medications    Prior to Admission medications   Medication Sig Start Date End Date Taking? Authorizing Provider  atenolol-chlorthalidone (TENORETIC) 50-25 MG tablet Take 1 tablet by mouth daily. 11/08/19   Moshe Cipro, NP    Family History History reviewed. No pertinent family history.  Social History Social History   Tobacco Use   Smoking status: Current Every Day Smoker    Packs/day: 0.50   Smokeless tobacco: Never Used  Vaping Use   Vaping Use: Never used  Substance Use Topics   Alcohol use: No   Drug use: No     Allergies   Patient has no known allergies.   Review of Systems Review of Systems   Physical Exam Triage Vital Signs ED Triage Vitals  Enc Vitals Group     BP 11/08/19 1304 (!) 158/109     Pulse Rate 11/08/19 1304 73     Resp 11/08/19 1304 16     Temp 11/08/19 1304 98.8 F (37.1 C)     Temp Source 11/08/19 1304 Oral     SpO2 11/08/19 1304 98 %     Weight 11/08/19 1303 145 lb (65.8 kg)     Height 11/08/19 1303 5\' 4"  (1.626 m)     Head Circumference --      Peak Flow --      Pain Score --      Pain Loc --      Pain Edu? --      Excl. in GC? --    No data found.  Updated Vital Signs BP (!) 158/109 (BP Location: Left Arm)    Pulse 73    Temp 98.8 F (37.1 C)  (Oral)    Resp 16    Ht 5\' 4"  (1.626 m)    Wt 145 lb (65.8 kg)    SpO2 98%    BMI 24.89 kg/m   Visual Acuity Right Eye Distance:   Left Eye Distance:   Bilateral Distance:    Right Eye Near:   Left Eye Near:    Bilateral Near:     Physical Exam Vitals and nursing note reviewed.  Constitutional:      Appearance: Normal appearance. He is well-developed.  HENT:     Head: Normocephalic and atraumatic.  Eyes:     Extraocular Movements: Extraocular movements intact.     Conjunctiva/sclera: Conjunctivae normal.     Pupils: Pupils are equal, round, and reactive to light.  Cardiovascular:     Rate and Rhythm: Normal rate and regular rhythm.     Heart sounds: Normal heart sounds. No murmur heard.   Pulmonary:     Effort: Pulmonary effort is normal. No respiratory distress.     Breath sounds: Normal breath sounds. No stridor.  No wheezing, rhonchi or rales.  Chest:     Chest wall: No tenderness.  Abdominal:     Palpations: Abdomen is soft.     Tenderness: There is no abdominal tenderness.  Musculoskeletal:     Cervical back: Neck supple.  Skin:    General: Skin is warm and dry.     Capillary Refill: Capillary refill takes less than 2 seconds.  Neurological:     General: No focal deficit present.     Mental Status: He is alert and oriented to person, place, and time.  Psychiatric:        Mood and Affect: Mood normal.        Behavior: Behavior normal.        Thought Content: Thought content normal.      UC Treatments / Results  Labs (all labs ordered are listed, but only abnormal results are displayed) Labs Reviewed - No data to display  EKG   Radiology No results found.  Procedures Procedures (including critical care time)  Medications Ordered in UC Medications - No data to display  Initial Impression / Assessment and Plan / UC Course  I have reviewed the triage vital signs and the nursing notes.  Pertinent labs & imaging results that were available during my  care of the patient were reviewed by me and considered in my medical decision making (see chart for details).     Medication Refill  Hypertension  Discussed with patient the need to establish primary care States that he is in the process of doing this since he has insurance Refilled atenolol-chlorthalidone 50-25 Follow up with primary care  Final Clinical Impressions(s) / UC Diagnoses   Final diagnoses:  Medication refill  Essential hypertension     Discharge Instructions     I have refilled your blood pressure medication  Follow up with primary care for future refills    ED Prescriptions    Medication Sig Dispense Auth. Provider   atenolol-chlorthalidone (TENORETIC) 50-25 MG tablet Take 1 tablet by mouth daily. 90 tablet Moshe Cipro, NP     PDMP not reviewed this encounter.   Moshe Cipro, NP 11/08/19 1320

## 2019-11-08 NOTE — Discharge Instructions (Addendum)
I have refilled your blood pressure medication  Follow up with primary care for future refills

## 2019-12-03 ENCOUNTER — Telehealth (HOSPITAL_COMMUNITY): Payer: Self-pay

## 2019-12-03 MED ORDER — ATENOLOL-CHLORTHALIDONE 50-25 MG PO TABS: 1.0000 | ORAL_TABLET | Freq: Every day | ORAL | 0 refills | Status: DC

## 2020-02-24 ENCOUNTER — Ambulatory Visit (HOSPITAL_COMMUNITY)
Admission: EM | Admit: 2020-02-24 | Discharge: 2020-02-24 | Disposition: A | Discharge status: Home / Self Care | Payer: 59 | Attending: Urgent Care | Admitting: Urgent Care

## 2020-02-24 ENCOUNTER — Encounter (HOSPITAL_COMMUNITY): Payer: Self-pay

## 2020-02-24 DIAGNOSIS — I10 Essential (primary) hypertension: Secondary | ICD-10-CM

## 2020-02-24 DIAGNOSIS — R030 Elevated blood-pressure reading, without diagnosis of hypertension: Secondary | ICD-10-CM

## 2020-02-24 MED ORDER — ATENOLOL-CHLORTHALIDONE 50-25 MG PO TABS: 1.0000 | ORAL_TABLET | Freq: Every day | ORAL | 0 refills | Status: DC

## 2020-02-24 NOTE — ED Provider Notes (Signed)
[facility]   MRN: [id number] DOB: 1965/04/13  Subjective:   Nathan Benson is a 54 y.o. male presenting for medication refill.  Patient states that his blood pressure has stayed high.  Admits that he does not eat healthily, eats a lot of fast food.  Does not have a PCP and wants recommendations for this.  Denies fever, headache, confusion, weakness, chest pain, heart racing, belly pain, hematuria, shortness of breath.  Patient is a smoker as well, does about half pack to a full pack per day.  No current facility-administered medications for this encounter.  Current Outpatient Medications:  .  atenolol-chlorthalidone (TENORETIC) 50-25 MG tablet, Take 1 tablet by mouth daily., Disp: 90 tablet, Rfl: 0   No Known Allergies  Past Medical History:  Diagnosis Date  . Hypertension      History reviewed. No pertinent surgical history.  History reviewed. No pertinent family history.  Social History   Tobacco Use  . Smoking status: Current Every Day Smoker    Packs/day: 0.50  . Smokeless tobacco: Never Used  Vaping Use  . Vaping Use: Never used  Substance Use Topics  . Alcohol use: No  . Drug use: No    ROS   Objective:   Vitals: BP (!) 155/94 (BP Location: Right Arm)   Pulse 89   Temp 98.5 F (36.9 C) (Oral)   Resp 18   SpO2 100%   Physical Exam Constitutional:      General: He is not in acute distress.    Appearance: Normal appearance. He is well-developed. He is not ill-appearing, toxic-appearing or diaphoretic.  HENT:     Head: Normocephalic and atraumatic.     Right Ear: External ear normal.     Left Ear: External ear normal.     Nose: Nose normal.     Mouth/Throat:     Mouth: Mucous membranes are moist.     Pharynx: Oropharynx is clear.  Eyes:     General: No scleral icterus.    Extraocular Movements: Extraocular movements intact.     Pupils: Pupils are equal, round, and reactive to light.  Cardiovascular:     Rate and Rhythm:  Normal rate and regular rhythm.     Heart sounds: Normal heart sounds. No murmur heard. No friction rub. No gallop.   Pulmonary:     Effort: Pulmonary effort is normal. No respiratory distress.     Breath sounds: Normal breath sounds. No stridor. No wheezing, rhonchi or rales.  Neurological:     Mental Status: He is alert and oriented to person, place, and time.     Cranial Nerves: No cranial nerve deficit.     Motor: No weakness.     Coordination: Coordination normal.     Gait: Gait normal.     Deep Tendon Reflexes: Reflexes normal.     Comments: Negative Romberg and pronator drift.  Psychiatric:        Mood and Affect: Mood normal.        Behavior: Behavior normal.        Thought Content: Thought content normal.     Assessment and Plan :   PDMP not reviewed this encounter.  1. Essential hypertension   2. Elevated blood pressure reading     Counseled patient on general management of hypertension, refilled his medication.  Recommended dietary modifications.  Physical exam findings reassuring for outpatient management.  Follow-up with new PCP, information provided to the patient. Counseled patient on potential  for adverse effects with medications prescribed/recommended today, ER and return-to-clinic precautions discussed, patient verbalized understanding.    Wallis Bamberg, [place] 02/24/20 1928

## 2020-02-24 NOTE — Discharge Instructions (Signed)

## 2020-02-24 NOTE — ED Triage Notes (Signed)
Pt presents for medication refill on blood pressure meds.

## 2020-06-03 ENCOUNTER — Ambulatory Visit (HOSPITAL_COMMUNITY)
Admission: EM | Admit: 2020-06-03 | Discharge: 2020-06-03 | Disposition: A | Discharge status: Home / Self Care | Payer: 59

## 2020-06-03 ENCOUNTER — Other Ambulatory Visit: Payer: Self-pay

## 2020-06-03 ENCOUNTER — Encounter (HOSPITAL_COMMUNITY): Payer: Self-pay

## 2020-06-03 DIAGNOSIS — L230 Allergic contact dermatitis due to metals: Secondary | ICD-10-CM | POA: Diagnosis not present

## 2020-06-03 DIAGNOSIS — Z9109 Other allergy status, other than to drugs and biological substances: Secondary | ICD-10-CM | POA: Diagnosis not present

## 2020-06-03 DIAGNOSIS — Z760 Encounter for issue of repeat prescription: Secondary | ICD-10-CM | POA: Diagnosis not present

## 2020-06-03 DIAGNOSIS — I10 Essential (primary) hypertension: Secondary | ICD-10-CM | POA: Diagnosis not present

## 2020-06-03 MED ORDER — TRIAMCINOLONE ACETONIDE 0.5 % EX OINT: 1.0000 "application " | TOPICAL_OINTMENT | Freq: Two times a day (BID) | CUTANEOUS | 0 refills | Status: AC

## 2020-06-03 MED ORDER — TRIAMCINOLONE ACETONIDE 0.5 % EX OINT: 1.0000 "application " | TOPICAL_OINTMENT | Freq: Two times a day (BID) | CUTANEOUS | 0 refills | Status: DC

## 2020-06-03 MED ORDER — ATENOLOL-CHLORTHALIDONE 50-25 MG PO TABS: 1.0000 | ORAL_TABLET | Freq: Every day | ORAL | 2 refills | Status: DC

## 2020-06-03 NOTE — ED Triage Notes (Signed)
Patient presents to Urgent Care with complaints of lower abdominal rash that he noted Monday. He states he has been applying hydrocortisone cream with some relief. He also requests BP med refill. He does not have a pcp and ran out of his atenolol 2 days ago.    Denies fever.

## 2020-06-03 NOTE — Discharge Instructions (Addendum)
-  Continue the atenolol-chlorthalidone blood pressure medication. -For your rash, you can use the Kenalog (triamcinolone) ointment-twice daily.  You can use this for up to 7 days in a row. -Try to establish care with your new primary care provider in the next few months. -If you develop chest pain, shortness of breath, vision changes, the worst headache of your life- head straight to the ED or call 911.

## 2020-06-03 NOTE — ED Provider Notes (Signed)
[facility]    CSN: [id number] Arrival date & time: 06/03/20  1248      History   Chief Complaint Chief Complaint  Patient presents with  . Hypertension  . Rash    HPI Nathan Benson is a 55 y.o. male presenting for rash and BP medication refill. History hypertension, taking atenolol-chlorthalidone.  States he has been on the blood pressure medication for months and is doing well on this.  Is working to establish care with a primary care, but their schedule has not allowed this yet.  Requesting refill on the atenolol-chlorthalidone.  Denies any side effects.  Denies chest pain, shortness of breath, dizziness.  Also states he has a rash where his belt buckle presses on his lower stomach.  Has had this issue before and knows he is allergic to nickel but he he likes his belt buckle and is continue to wear it.  Some improvement with over-the-counter cortisone cream.  Denies fever/chills, rashes elsewhere.   HPI  Past Medical History:  Diagnosis Date  . Hypertension     There are no problems to display for this patient.   History reviewed. No pertinent surgical history.     Home Medications    Prior to Admission medications   Medication Sig Start Date End Date Taking? Authorizing Provider  atenolol-chlorthalidone (TENORETIC) 50-25 MG tablet Take 1 tablet by mouth daily. 06/03/20  Yes Rhys Martini, PA-C  triamcinolone ointment (KENALOG) 0.5 % Apply 1 application topically 2 (two) times daily for 7 days. 06/03/20 06/10/20  Rhys Martini, PA-C  lisinopril-hydrochlorothiazide (ZESTORETIC) 20-25 MG tablet lisinopril 20 mg-hydrochlorothiazide 25 mg tablet  TAKE 1 TABLET BY MOUTH EVERY DAY  06/03/20  [provider]    Family History History reviewed. No pertinent family history.  Social History Social History   Tobacco Use  . Smoking status: Current Every Day Smoker    Packs/day: 0.50  . Smokeless tobacco: Never Used  Vaping Use  . Vaping Use: Never  used  Substance Use Topics  . Alcohol use: No  . Drug use: No     Allergies   Patient has no known allergies.   Review of Systems Review of Systems  Skin: Positive for rash.  All other systems reviewed and are negative.    Physical Exam Triage Vital Signs ED Triage Vitals  Enc Vitals Group     BP      Pulse      Resp      Temp      Temp src      SpO2      Weight      Height      Head Circumference      Peak Flow      Pain Score      Pain Loc      Pain Edu?      Excl. in GC?    No data found.  Updated Vital Signs BP (!) 142/94 (BP Location: Right Arm)   Pulse 79   Temp 98.6 F (37 C) (Oral)   Resp 16   Wt 145 lb (65.8 kg)   SpO2 97%   BMI 24.89 kg/m   Visual Acuity Right Eye Distance:   Left Eye Distance:   Bilateral Distance:    Right Eye Near:   Left Eye Near:    Bilateral Near:     Physical Exam Vitals reviewed.  Constitutional:      Appearance: Normal appearance.  HENT:  Head: Normocephalic and atraumatic.  Cardiovascular:     Rate and Rhythm: Normal rate and regular rhythm.     Heart sounds: Normal heart sounds.  Pulmonary:     Effort: Pulmonary effort is normal.     Breath sounds: Normal breath sounds.  Skin:         Comments: Lower abd with faint erythematous rash in belt buckle distribution. No edema, warmth, discharge. No other rashes.  Neurological:     General: No focal deficit present.     Mental Status: He is alert and oriented to person, place, and time.  Psychiatric:        Mood and Affect: Mood normal.        Behavior: Behavior normal.        Thought Content: Thought content normal.        Judgment: Judgment normal.      UC Treatments / Results  Labs (all labs ordered are listed, but only abnormal results are displayed) Labs Reviewed - No data to display  EKG   Radiology No results found.  Procedures Procedures (including critical care time)  Medications Ordered in UC Medications - No data to  display  Initial Impression / Assessment and Plan / UC Course  I have reviewed the triage vital signs and the nursing notes.  Pertinent labs & imaging results that were available during my care of the patient were reviewed by me and considered in my medical decision making (see chart for details).     This patient is a 55 year old male presenting for medication refill and rash. Today this pt is afebrile nontachycardic nontachypneic, oxygenating well on room air, no wheezes rhonchi or rales.   Requesting refill on the atenolol-chlorthalidone. Provided a 69-month refill today. Blood pressure is at goal today.  For allergic contact dermatitis, sent triamcinolone ointment.  Stop the over-the-counter cortisone.  Recommended against wearing materials with nickel in them.  Establish care with primary care as planned.  ED return precautions discussed.  This chart was dictated using voice recognition software, Dragon. Despite the best efforts of this provider to proofread and correct errors, errors may still occur which can change documentation meaning.   Final Clinical Impressions(s) / UC Diagnoses   Final diagnoses:  Essential hypertension  Nickel allergy  Medication refill  Allergic contact dermatitis due to metals     Discharge Instructions     -Continue the atenolol-chlorthalidone blood pressure medication. -For your rash, you can use the Kenalog (triamcinolone) ointment-twice daily.  You can use this for up to 7 days in a row. -Try to establish care with your new primary care provider in the next few months. -If you develop chest pain, shortness of breath, vision changes, the worst headache of your life- head straight to the ED or call 911.     ED Prescriptions    Medication Sig Dispense Auth. Provider   atenolol-chlorthalidone (TENORETIC) 50-25 MG tablet Take 1 tablet by mouth daily. 30 tablet Rhys Martini, PA-C   triamcinolone ointment (KENALOG) 0.5 %  (Status:  Discontinued) Apply 1 application topically 2 (two) times daily. 15 g Rhys Martini, PA-C   triamcinolone ointment (KENALOG) 0.5 % Apply 1 application topically 2 (two) times daily for 7 days. 15 g Rhys Martini, PA-C     PDMP not reviewed this encounter.   Rhys Martini, PA-C 06/03/20 1333

## 2020-08-30 ENCOUNTER — Encounter (HOSPITAL_COMMUNITY): Payer: Self-pay

## 2020-08-30 ENCOUNTER — Ambulatory Visit (HOSPITAL_COMMUNITY)
Admission: EM | Admit: 2020-08-30 | Discharge: 2020-08-30 | Disposition: A | Discharge status: Home / Self Care | Payer: 59 | Attending: Family Medicine | Admitting: Family Medicine

## 2020-08-30 ENCOUNTER — Other Ambulatory Visit: Payer: Self-pay

## 2020-08-30 DIAGNOSIS — I10 Essential (primary) hypertension: Secondary | ICD-10-CM | POA: Diagnosis present

## 2020-08-30 DIAGNOSIS — R030 Elevated blood-pressure reading, without diagnosis of hypertension: Secondary | ICD-10-CM | POA: Diagnosis not present

## 2020-08-30 LAB — BASIC METABOLIC PANEL
Anion gap: 12 (ref 5–15)
BUN: 16 mg/dL (ref 6–20)
CO2: 27 mmol/L (ref 22–32)
Calcium: 9.4 mg/dL (ref 8.9–10.3)
Chloride: 95 mmol/L — ABNORMAL LOW (ref 98–111)
Creatinine, Ser: 0.98 mg/dL (ref 0.61–1.24)
GFR, Estimated: >60 mL/min (ref >60)
Glucose, Bld: 93 mg/dL (ref 70–99)
Potassium: 3.5 mmol/L (ref 3.5–5.1)
Sodium: 134 mmol/L — ABNORMAL LOW (ref 135–145)

## 2020-08-30 MED ORDER — ATENOLOL-CHLORTHALIDONE 50-25 MG PO TABS: 1.0000 | ORAL_TABLET | Freq: Every day | ORAL | 2 refills | Status: DC

## 2020-08-30 NOTE — ED Triage Notes (Signed)
Pt in for medication refill (tenoretic)  Pt states he ran out yesterday  Denies any headache or vision changes

## 2020-08-30 NOTE — ED Provider Notes (Signed)
[facility]    CSN: [id number] Arrival date & time: 08/30/20  1704      History   Chief Complaint Chief Complaint  Patient presents with   Medication Refill    HPI Nathan Benson is a 55 y.o. male.   Patient presenting today requesting medication refill on his atenolol chlorthalidone.  He states he is taking this for quite some time for his hypertension with excellent benefit.  Denies side effects and states home blood pressures running around 1 15-1 20 over 80s.  Denies chest pain, shortness of breath, headaches, dizziness, visual changes.  No new concerns.  Does not have a PCP.   Past Medical History:  Diagnosis Date   Hypertension     There are no problems to display for this patient.   History reviewed. No pertinent surgical history.     Home Medications    Prior to Admission medications   Medication Sig Start Date End Date Taking? Authorizing Provider  atenolol-chlorthalidone (TENORETIC) 50-25 MG tablet Take 1 tablet by mouth daily. 08/30/20   Particia Nearing, PA-C  lisinopril-hydrochlorothiazide (ZESTORETIC) 20-25 MG tablet lisinopril 20 mg-hydrochlorothiazide 25 mg tablet  TAKE 1 TABLET BY MOUTH EVERY DAY  06/03/20  [provider]    Family History History reviewed. No pertinent family history.  Social History Social History   Tobacco Use   Smoking status: Every Day    Packs/day: 0.50    Pack years: 0.00    Types: Cigarettes   Smokeless tobacco: Never  Vaping Use   Vaping Use: Never used  Substance Use Topics   Alcohol use: No   Drug use: No     Allergies   Patient has no known allergies.   Review of Systems Review of Systems Per HPI  Physical Exam Triage Vital Signs ED Triage Vitals  Enc Vitals Group     BP 08/30/20 1808 (!) 146/97     Pulse Rate 08/30/20 1808 77     Resp 08/30/20 1808 16     Temp 08/30/20 1808 98.6 F (37 C)     Temp Source 08/30/20 1808 Oral     SpO2 08/30/20 1808 100 %     Weight  --      Height --      Head Circumference --      Peak Flow --      Pain Score 08/30/20 1806 0     Pain Loc --      Pain Edu? --      Excl. in GC? --    No data found.  Updated Vital Signs BP (!) 146/97 (BP Location: Right Arm)   Pulse 77   Temp 98.6 F (37 C) (Oral)   Resp 16   SpO2 100%   Visual Acuity Right Eye Distance:   Left Eye Distance:   Bilateral Distance:    Right Eye Near:   Left Eye Near:    Bilateral Near:     Physical Exam Vitals and nursing note reviewed.  Constitutional:      Appearance: Normal appearance.  HENT:     Head: Atraumatic.     Mouth/Throat:     Mouth: Mucous membranes are moist.  Eyes:     Extraocular Movements: Extraocular movements intact.     Conjunctiva/sclera: Conjunctivae normal.  Cardiovascular:     Rate and Rhythm: Normal rate and regular rhythm.     Heart sounds: Normal heart sounds.  Pulmonary:     Effort: Pulmonary effort  is normal.     Breath sounds: Normal breath sounds.  Musculoskeletal:        General: Normal range of motion.     Cervical back: Normal range of motion and neck supple.  Skin:    General: Skin is warm and dry.  Neurological:     General: No focal deficit present.     Mental Status: He is oriented to person, place, and time.  Psychiatric:        Mood and Affect: Mood normal.        Thought Content: Thought content normal.        Judgment: Judgment normal.   UC Treatments / Results  Labs (all labs ordered are listed, but only abnormal results are displayed) Labs Reviewed  BASIC METABOLIC PANEL    EKG   Radiology No results found.  Procedures Procedures (including critical care time)  Medications Ordered in UC Medications - No data to display  Initial Impression / Assessment and Plan / UC Course  I have reviewed the triage vital signs and the nursing notes.  Pertinent labs & imaging results that were available during my care of the patient were reviewed by me and considered in my  medical decision making (see chart for details).     Blood pressure mildly elevated today but patient states he ran out of his medication yesterday and has not been able to take anything today.  Appears to be under good control per patient report historically on this dose, will continue this dose and obtain BMP today as no access to recent lab results for patient.  PCP assistance initiated to help him find outpatient follow-up.  Final Clinical Impressions(s) / UC Diagnoses   Final diagnoses:  Elevated blood pressure reading  Essential hypertension   Discharge Instructions   None    ED Prescriptions     Medication Sig Dispense Auth. Provider   atenolol-chlorthalidone (TENORETIC) 50-25 MG tablet Take 1 tablet by mouth daily. 30 tablet Particia Nearing, [place]      PDMP not reviewed this encounter.   Particia Nearing, [place] 08/30/20 [phone]

## 2020-11-23 ENCOUNTER — Other Ambulatory Visit: Payer: Self-pay | Admitting: Family Medicine
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
## 2021-04-16 ENCOUNTER — Other Ambulatory Visit: Payer: Self-pay

## 2021-04-16 ENCOUNTER — Encounter (HOSPITAL_COMMUNITY): Payer: Self-pay | Admitting: Emergency Medicine

## 2021-04-16 ENCOUNTER — Ambulatory Visit (HOSPITAL_COMMUNITY)
Admission: EM | Admit: 2021-04-16 | Discharge: 2021-04-16 | Disposition: A | Discharge status: Home / Self Care | Payer: 59 | Attending: Physician Assistant | Admitting: Physician Assistant

## 2021-04-16 DIAGNOSIS — J069 Acute upper respiratory infection, unspecified: Secondary | ICD-10-CM | POA: Insufficient documentation

## 2021-04-16 DIAGNOSIS — R509 Fever, unspecified: Secondary | ICD-10-CM

## 2021-04-16 DIAGNOSIS — U071 COVID-19: Secondary | ICD-10-CM | POA: Diagnosis not present

## 2021-04-16 DIAGNOSIS — Z760 Encounter for issue of repeat prescription: Secondary | ICD-10-CM | POA: Diagnosis not present

## 2021-04-16 DIAGNOSIS — Z79899 Other long term (current) drug therapy: Secondary | ICD-10-CM | POA: Diagnosis not present

## 2021-04-16 DIAGNOSIS — I10 Essential (primary) hypertension: Secondary | ICD-10-CM | POA: Insufficient documentation

## 2021-04-16 DIAGNOSIS — R000 Tachycardia, unspecified: Secondary | ICD-10-CM | POA: Diagnosis not present

## 2021-04-16 LAB — POC INFLUENZA A AND B ANTIGEN (URGENT CARE ONLY)
INFLUENZA A ANTIGEN, POC: NEGATIVE
INFLUENZA B ANTIGEN, POC: NEGATIVE

## 2021-04-16 MED ORDER — ATENOLOL-CHLORTHALIDONE 50-25 MG PO TABS
1.0000 | ORAL_TABLET | Freq: Every day | ORAL | 1 refills | Status: DC
Start: 2021-04-16 — End: 2021-05-19

## 2021-04-16 MED ORDER — ACETAMINOPHEN 325 MG PO TABS
ORAL_TABLET | ORAL | Status: AC
  Filled 2021-04-16: qty 3

## 2021-04-16 MED ORDER — ACETAMINOPHEN 325 MG PO TABS
975.0000 mg | ORAL_TABLET | Freq: Once | ORAL | Status: AC
  Administered 2021-04-16: 975 mg via ORAL

## 2021-04-16 NOTE — ED Triage Notes (Signed)
Pt reports HTN. Pt states he has been out of his BP meds for a week.

## 2021-04-16 NOTE — Discharge Instructions (Signed)
I believe that you have a virus causing your fever and cold symptoms.  Please alternate Tylenol and ibuprofen for pain and fever.  Use Mucinex and Flonase for cough and congestion.  Make sure you rest and drink plenty of fluid.  We will contact you if your COVID test is positive.  If you develop any worsening symptoms including persistent high fever not responding to medication, nausea/vomiting interfering with oral intake, chest pain, shortness of breath, severe cough you need to be seen immediately.  If symptoms or not improved by next week please return here or see your PCP.  Your blood pressure is elevated.  Please continue medication as prescribed.  You will need to follow-up with your primary care provider as scheduled for refills and recheck.  If you develop any chest pain, shortness of breath, headache, dizziness, vision changes in setting of high blood pressure you need to go to the emergency room.

## 2021-04-16 NOTE — ED Provider Notes (Signed)
[facility]    CSN: [id number] Arrival date & time: 04/16/21  1219      History   Chief Complaint Chief Complaint  Patient presents with   Hypertension    HPI Nathan Benson is a 56 y.o. male.   Patient presents today for reevaluation of hypertension.  He has a history of hypertension currently managed with atenolol and chlorthalidone combination.  Reports taking medication as prescribed without missing doses or noted side effects.  He has been monitoring his blood pressure occasionally outside of office visits and reports is generally been at goal while on the medication.  His last dose was approximately 1 week ago.  He avoids sodium, caffeine, decongestants.  He is physically active at his appointment.  Denies any chest pain, shortness of breath, leg swelling, vision change, headache, dizziness.  Patient was noted to be febrile and tachycardic intake.  Upon further discussion reports he has had URI symptoms for the past several days including mild headache, nasal congestion, cough.  Denies any chest pain, shortness of breath, nausea, vomiting, diarrhea.  He has been taking TheraFlu with improvement of symptoms.  He does report sick contacts but does not know what they were diagnosed with.  He has had COVID-19 and influenza vaccines.  He has not had COVID in the past.  He denies history of asthma, allergies, COPD.  He does smoke.  Denies any recent antibiotic use.   Past Medical History:  Diagnosis Date   Hypertension     There are no problems to display for this patient.   History reviewed. No pertinent surgical history.     Home Medications    Prior to Admission medications   Medication Sig Start Date End Date Taking? Authorizing Provider  atenolol-chlorthalidone (TENORETIC) 50-25 MG tablet Take 1 tablet by mouth daily. 04/16/21   Liani Caris, Derry Skill, PA-C  lisinopril-hydrochlorothiazide (ZESTORETIC) 20-25 MG tablet lisinopril 20 mg-hydrochlorothiazide 25 mg  tablet  TAKE 1 TABLET BY MOUTH EVERY DAY  06/03/20  [provider]    Family History History reviewed. No pertinent family history.  Social History Social History   Tobacco Use   Smoking status: Every Day    Packs/day: 0.50    Types: Cigarettes   Smokeless tobacco: Never  Vaping Use   Vaping Use: Never used  Substance Use Topics   Alcohol use: No   Drug use: No     Allergies   Patient has no known allergies.   Review of Systems Review of Systems  Constitutional:  Positive for activity change. Negative for appetite change, fatigue and fever.  HENT:  Positive for congestion, postnasal drip, sinus pressure and sore throat. Negative for sneezing.   Eyes:  Negative for photophobia and visual disturbance.  Respiratory:  Positive for cough. Negative for shortness of breath.   Cardiovascular:  Negative for chest pain.  Gastrointestinal:  Negative for abdominal pain, diarrhea, nausea and vomiting.  Musculoskeletal:  Negative for arthralgias and myalgias.  Neurological:  Positive for headaches. Negative for dizziness and light-headedness.    Physical Exam Triage Vital Signs ED Triage Vitals  Enc Vitals Group     BP 04/16/21 1338 137/87     Pulse Rate 04/16/21 1338 (!) 124     Resp 04/16/21 1338 17     Temp 04/16/21 1338 (!) 102.4 F (39.1 C)     Temp Source 04/16/21 1338 Oral     SpO2 04/16/21 1338 95 %     Weight 04/16/21 1339 145 lb  1 oz (65.8 kg)     Height 04/16/21 1339 5\' 4"  (1.626 m)     Head Circumference --      Peak Flow --      Pain Score 04/16/21 1339 0     Pain Loc --      Pain Edu? --      Excl. in [place]? --    No data found.  Updated Vital Signs BP (!) 149/96 (BP Location: Left Arm)    Pulse (!) 113    Temp (!) 102.4 F (39.1 C) (Oral)    Resp 17    Ht 5\' 4"  (1.626 m)    Wt 145 lb 1 oz (65.8 kg)    SpO2 100%    BMI 24.90 kg/m   Visual Acuity Right Eye Distance:   Left Eye Distance:   Bilateral Distance:    Right Eye Near:   Left Eye  Near:    Bilateral Near:     Physical Exam Vitals reviewed.  Constitutional:      General: He is awake.     Appearance: Normal appearance. He is well-developed. He is not ill-appearing.     Comments: Very pleasant male appears stated age in no acute distress sitting comfortably in exam room  HENT:     Head: Normocephalic and atraumatic.     Right Ear: Tympanic membrane, ear canal and external ear normal. Tympanic membrane is not erythematous or bulging.     Left Ear: Tympanic membrane, ear canal and external ear normal. Tympanic membrane is not erythematous or bulging.     Nose: Nose normal.     Mouth/Throat:     Pharynx: Uvula midline. Posterior oropharyngeal erythema present. No oropharyngeal exudate or uvula swelling.  Cardiovascular:     Rate and Rhythm: Normal rate and regular rhythm.     Heart sounds: Normal heart sounds, S1 normal and S2 normal. No murmur heard. Pulmonary:     Effort: Pulmonary effort is normal. No accessory muscle usage or respiratory distress.     Breath sounds: Normal breath sounds. No stridor. No wheezing, rhonchi or rales.     Comments: Clear to auscultation bilaterally Abdominal:     General: Bowel sounds are normal.     Palpations: Abdomen is soft.     Tenderness: There is no abdominal tenderness.  Neurological:     Mental Status: He is alert.  Psychiatric:        Behavior: Behavior is cooperative.     UC Treatments / Results  Labs (all labs ordered are listed, but only abnormal results are displayed) Labs Reviewed  SARS CORONAVIRUS 2 (TAT 6-24 HRS)  POC INFLUENZA A AND B ANTIGEN (URGENT CARE ONLY)    EKG   Radiology No results found.  Procedures Procedures (including critical care time)  Medications Ordered in UC Medications  acetaminophen (TYLENOL) tablet 975 mg (975 mg Oral Given 04/16/21 1354)    Initial Impression / Assessment and Plan / UC Course  I have reviewed the triage vital signs and the nursing notes.  Pertinent  labs & imaging results that were available during my care of the patient were reviewed by me and considered in my medical decision making (see chart for details).     Blood pressure is slightly elevated today.  Patient will restart atenolol/chlorthalidone combination previously prescribed.  Discussed with utility of repeating BMP as last one showed mild hyponatremia in June 2022.  Patient declined this today as he is scheduled to see new PCP  05/19/2021 will have blood work obtained at this visit.  Recommended he avoid sodium, caffeine, decongestants, NSAIDs.  He is to monitor his blood pressure at home.  If he develops any chest pain, shortness of breath, headache, vision changes, dizziness in the setting of high blood pressure needs to go to the emergency room.  Suspect viral etiology of URI.  Flu testing was negative in clinic.  COVID test is pending.  Patient was provided work excuse note with current CDC return to work guidelines based on COVID test result.  Recommended that he rest and drink plenty of fluid.  He is to use Tylenol and ibuprofen for pain and fever as well as Flonase and Mucinex for cough/congestion.  Discussed that if he has any worsening symptoms including high fever not responding to medication, chest pain, shortness of breath, severe cough, nausea/vomiting interfering with oral intake he needs to go to the emergency room.  If symptoms have not improved by next week he is to return here or see his PCP.  Strict return precautions given to which he expressed understanding.  Final Clinical Impressions(s) / UC Diagnoses   Final diagnoses:  Upper respiratory tract infection, unspecified type  Fever, unspecified  Essential hypertension  Medication refill  Elevated blood pressure reading in office with diagnosis of hypertension     Discharge Instructions      I believe that you have a virus causing your fever and cold symptoms.  Please alternate Tylenol and ibuprofen for pain and  fever.  Use Mucinex and Flonase for cough and congestion.  Make sure you rest and drink plenty of fluid.  We will contact you if your COVID test is positive.  If you develop any worsening symptoms including persistent high fever not responding to medication, nausea/vomiting interfering with oral intake, chest pain, shortness of breath, severe cough you need to be seen immediately.  If symptoms or not improved by next week please return here or see your PCP.  Your blood pressure is elevated.  Please continue medication as prescribed.  You will need to follow-up with your primary care provider as scheduled for refills and recheck.  If you develop any chest pain, shortness of breath, headache, dizziness, vision changes in setting of high blood pressure you need to go to the emergency room.     ED Prescriptions     Medication Sig Dispense Auth. Provider   atenolol-chlorthalidone (TENORETIC) 50-25 MG tablet Take 1 tablet by mouth daily. 30 tablet Allura Doepke, Derry Skill, PA-C      PDMP not reviewed this encounter.   Terrilee Croak, PA-C 04/16/21 1527

## 2021-04-17 LAB — SARS CORONAVIRUS 2 (TAT 6-24 HRS): SARS Coronavirus 2: POSITIVE — AB

## 2021-05-19 ENCOUNTER — Ambulatory Visit (HOSPITAL_BASED_OUTPATIENT_CLINIC_OR_DEPARTMENT_OTHER): Payer: 59 | Admitting: Family Medicine

## 2021-05-19 ENCOUNTER — Encounter (HOSPITAL_BASED_OUTPATIENT_CLINIC_OR_DEPARTMENT_OTHER): Payer: Self-pay | Admitting: Family Medicine

## 2021-05-19 ENCOUNTER — Other Ambulatory Visit: Payer: Self-pay

## 2021-05-19 DIAGNOSIS — R051 Acute cough: Secondary | ICD-10-CM | POA: Diagnosis not present

## 2021-05-19 DIAGNOSIS — I10 Essential (primary) hypertension: Secondary | ICD-10-CM | POA: Diagnosis not present

## 2021-05-19 MED ORDER — ATENOLOL-CHLORTHALIDONE 50-25 MG PO TABS: 1.0000 | ORAL_TABLET | Freq: Every day | ORAL | 1 refills | Status: DC

## 2021-05-19 NOTE — Patient Instructions (Signed)
?  Medication Instructions:  ?Your physician recommends that you continue on your current medications as directed. Please refer to the Current Medication list given to you today. ?--If you need a refill on any your medications before your next appointment, please call your pharmacy first. If no refills are authorized on file call the office.-- ? ? ? ?Follow-Up: ?Your next appointment:   ?Your physician recommends that you schedule a follow-up appointment in: 6 weeks CPE  nurse visit 1 weeks before for labs with Dr. de [place] ? ?You will receive a text message or e-mail with a link to a survey about your care and experience with [place] today! We would greatly appreciate your feedback!  ? ?Thanks for letting us be apart of your health journey!!  ?Primary Care and Sports Medicine  ? ?Dr. Marcy Salvo de [place]  ? ?We encourage you to activate your patient portal called "MyChart".  Sign up information is provided on this After Visit Summary.  MyChart is used to connect with patients for Virtual Visits (Telemedicine).  Patients are able to view lab/test results, encounter notes, upcoming appointments, etc.  Non-urgent messages can be sent to your provider as well. To learn more about what you can do with MyChart, please visit --  [email or web address].    ?

## 2021-05-19 NOTE — Assessment & Plan Note (Signed)
Suspect viral etiology, did discuss conservative measures, can continue with OTC medications ?Generally would expect symptoms to continue to improve over the coming week, did discuss that occasionally cough may linger for up to 4 to 6 weeks after illness.  If cough persists beyond this, consider further evaluation ?

## 2021-05-19 NOTE — Progress Notes (Signed)
? ?New Patient Office Visit ? ?Subjective:  ?Patient ID: Nathan Benson, male    DOB: Oct 29, 1965  Age: 56 y.o. MRN: [id number] ? ?CC:  ?Chief Complaint  ?Patient presents with  ? New Patient (Initial Visit)  ?  Patient presents today to establish care. He has never had a PCP always went to urgent care. Last visit at urgent care discovered high blood pressure, he was put on lisinopril. He is concerned with a cough and mucous x 1 week.  ? ? ?HPI ?Nathan Benson is a 56 year old male presenting to establish in clinic.  He has current concerns as outlined above.  Reports past medical history of hypertension. ? ?Hypertension: Reports being diagnosed at an urgent care in the past.  Generally denies having a primary care provider or regular medical evaluation outside of urgent care visits for medication refills.  He indicates that he is currently taking atenolol-chlorthalidone, reports that he has been taking this for about 2 years on and off.  Denies using any other medications in the past.  Currently not having any issues with chest pain, headaches, lightheadedness or dizziness ? ?Patient reports an acute cough which is been going on for about a week, generally has been improving.  Cough still lingers, generally feeling well overall.  Using OTC measures to help with controlling symptoms and feels that control is adequate. ? ?Patient is originally from [place], [place].  Has been living here for about 22 years.  He currently works for the city of [place].  Outside of work he enjoys playing cards, spending time with his kids ? ?Past Medical History:  ?Diagnosis Date  ? Hypertension   ? ? ?History reviewed. No pertinent surgical history. ? ?History reviewed. No pertinent family history. ? ?Social History  ? ?Socioeconomic History  ? Marital status: Single  ?  Spouse name: Not on file  ? Number of children: Not on file  ? Years of education: Not on file  ? Highest education level: Not on file  ?Occupational  History  ? Not on file  ?Tobacco Use  ? Smoking status: Every Day  ?  Packs/day: 0.50  ?  Types: Cigarettes  ? Smokeless tobacco: Never  ?Vaping Use  ? Vaping Use: Never used  ?Substance and Sexual Activity  ? Alcohol use: No  ? Drug use: No  ? Sexual activity: Not on file  ?Other Topics Concern  ? Not on file  ?Social History Narrative  ? Not on file  ? ?Social Determinants of Health  ? ?Financial Resource Strain: Not on file  ?Food Insecurity: Not on file  ?Transportation Needs: Not on file  ?Physical Activity: Not on file  ?Stress: Not on file  ?Social Connections: Not on file  ?Intimate Partner Violence: Not on file  ? ? ?Objective:  ? ?Today's Vitals: BP 122/82   Pulse 95   Ht 5\' 4"  (1.626 m)   Wt 150 lb 9.6 oz (68.3 kg)   SpO2 96%   BMI 25.85 kg/m?  ? ?Physical Exam ? ?56 year old male in no acute distress ?Cardiovascular exam regular rate and rhythm, no murmur appreciated ?Lungs clear to auscultation bilaterally ? ?Assessment & Plan:  ? ?Problem List Items Addressed This Visit   ? ?  ? Cardiovascular and Mediastinum  ? Hypertension  ?  Blood pressure adequate in office today ?We will continue with current regimen of atenolol- chlorthalidone.  Did discuss that this typically would not be our first-line medication in regards to treatment, however have  some reservations with making adjustment at this time given duration of beta-blocker use ?Recommend intermittent monitoring of blood pressure at home, DASH diet ?Consider addition of amlodipine if blood pressure not adequately controlled ?  ?  ? Relevant Medications  ? atenolol-chlorthalidone (TENORETIC) 50-25 MG tablet  ?  ? Other  ? Cough  ?  Suspect viral etiology, did discuss conservative measures, can continue with OTC medications ?Generally would expect symptoms to continue to improve over the coming week, did discuss that occasionally cough may linger for up to 4 to 6 weeks after illness.  If cough persists beyond this, consider further evaluation ?  ?   ? ?Other Visit Diagnoses   ? ? Wellness examination      ? Relevant Orders  ? CBC with Differential/Platelet  ? Comprehensive metabolic panel  ? Hemoglobin A1c  ? Lipid panel  ? TSH Rfx on Abnormal to Free T4  ? ?  ? ? ?Outpatient Encounter Medications as of 05/19/2021  ?Medication Sig  ? atenolol-chlorthalidone (TENORETIC) 50-25 MG tablet Take 1 tablet by mouth daily.  ? [DISCONTINUED] atenolol-chlorthalidone (TENORETIC) 50-25 MG tablet Take 1 tablet by mouth daily.  ? [DISCONTINUED] lisinopril-hydrochlorothiazide (ZESTORETIC) 20-25 MG tablet lisinopril 20 mg-hydrochlorothiazide 25 mg tablet ? TAKE 1 TABLET BY MOUTH EVERY DAY  ? ?No facility-administered encounter medications on file as of 05/19/2021.  ? ? ?Follow-up: Return in about 6 weeks (around 06/30/2021).  Plan for follow-up in about 1 to 2 months for CPE, nurse visit for labs 1 week prior ? ?Nikyla Navedo J [place], MD ? ?

## 2021-05-19 NOTE — Assessment & Plan Note (Signed)
Blood pressure adequate in office today ?We will continue with current regimen of atenolol- chlorthalidone.  Did discuss that this typically would not be our first-line medication in regards to treatment, however have some reservations with making adjustment at this time given duration of beta-blocker use ?Recommend intermittent monitoring of blood pressure at home, DASH diet ?Consider addition of amlodipine if blood pressure not adequately controlled ?

## 2021-06-16 ENCOUNTER — Ambulatory Visit (HOSPITAL_BASED_OUTPATIENT_CLINIC_OR_DEPARTMENT_OTHER): Payer: 59

## 2021-06-16 ENCOUNTER — Other Ambulatory Visit (HOSPITAL_BASED_OUTPATIENT_CLINIC_OR_DEPARTMENT_OTHER): Payer: Self-pay | Admitting: Family Medicine

## 2021-06-16 ENCOUNTER — Encounter (HOSPITAL_BASED_OUTPATIENT_CLINIC_OR_DEPARTMENT_OTHER): Payer: Self-pay | Admitting: Family Medicine

## 2021-06-17 LAB — COMPREHENSIVE METABOLIC PANEL
ALT: 32 IU/L (ref 0–44)
AST: 26 IU/L (ref 0–40)
Albumin/Globulin Ratio: 2.4 — ABNORMAL HIGH (ref 1.2–2.2)
Albumin: 4.7 g/dL (ref 3.8–4.9)
Alkaline Phosphatase: 61 IU/L (ref 44–121)
BUN/Creatinine Ratio: 16 (ref 9–20)
BUN: 18 mg/dL (ref 6–24)
Bilirubin Total: 0.5 mg/dL (ref 0.0–1.2)
CO2: 27 mmol/L (ref 20–29)
Calcium: 10 mg/dL (ref 8.7–10.2)
Chloride: 102 mmol/L (ref 96–106)
Creatinine, Ser: 1.1 mg/dL (ref 0.76–1.27)
Globulin, Total: 2 g/dL (ref 1.5–4.5)
Glucose: 104 mg/dL — ABNORMAL HIGH (ref 70–99)
Potassium: 3.9 mmol/L (ref 3.5–5.2)
Sodium: 144 mmol/L (ref 134–144)
Total Protein: 6.7 g/dL (ref 6.0–8.5)
eGFR: 80 mL/min/{1.73_m2} (ref >59)

## 2021-06-17 LAB — CBC WITH DIFFERENTIAL/PLATELET
Basophils Absolute: 0.1 10*3/uL (ref 0.0–0.2)
Basos: 1 %
EOS (ABSOLUTE): 0.1 10*3/uL (ref 0.0–0.4)
Eos: 1 %
Hematocrit: 42 % (ref 37.5–51.0)
Hemoglobin: 14.5 g/dL (ref 13.0–17.7)
Immature Grans (Abs): 0 10*3/uL (ref 0.0–0.1)
Immature Granulocytes: 0 %
Lymphocytes Absolute: 2.7 10*3/uL (ref 0.7–3.1)
Lymphs: 43 %
MCH: 28.3 pg (ref 26.6–33.0)
MCHC: 34.5 g/dL (ref 31.5–35.7)
MCV: 82 fL (ref 79–97)
Monocytes Absolute: 0.4 10*3/uL (ref 0.1–0.9)
Monocytes: 7 %
Neutrophils Absolute: 3 10*3/uL (ref 1.4–7.0)
Neutrophils: 48 %
Platelets: 270 10*3/uL (ref 150–450)
RBC: 5.13 x10E6/uL (ref 4.14–5.80)
RDW: 12.6 % (ref 11.6–15.4)
WBC: 6.3 10*3/uL (ref 3.4–10.8)

## 2021-06-17 LAB — LIPID PANEL
Chol/HDL Ratio: 4.6 ratio (ref 0.0–5.0)
Cholesterol, Total: 205 mg/dL — ABNORMAL HIGH (ref 100–199)
HDL: 45 mg/dL (ref >39)
LDL Chol Calc (NIH): 124 mg/dL — ABNORMAL HIGH (ref 0–99)
Triglycerides: 202 mg/dL — ABNORMAL HIGH (ref 0–149)
VLDL Cholesterol Cal: 36 mg/dL (ref 5–40)

## 2021-06-17 LAB — HEMOGLOBIN A1C
Est. average glucose Bld gHb Est-mCnc: 123 mg/dL
Hgb A1c MFr Bld: 5.9 % — ABNORMAL HIGH (ref 4.8–5.6)

## 2021-06-17 LAB — TSH RFX ON ABNORMAL TO FREE T4: TSH: 0.936 u[IU]/mL (ref 0.450–4.500)

## 2021-06-23 ENCOUNTER — Ambulatory Visit (HOSPITAL_BASED_OUTPATIENT_CLINIC_OR_DEPARTMENT_OTHER): Payer: 59

## 2021-06-30 ENCOUNTER — Encounter (HOSPITAL_BASED_OUTPATIENT_CLINIC_OR_DEPARTMENT_OTHER): Payer: Self-pay | Admitting: Family Medicine

## 2021-06-30 ENCOUNTER — Ambulatory Visit (INDEPENDENT_AMBULATORY_CARE_PROVIDER_SITE_OTHER): Payer: 59 | Admitting: Family Medicine

## 2021-06-30 VITALS — BP 145/93 | HR 81 | Temp 97.8°F | Ht 64.0 in | Wt 150.2 lb

## 2021-06-30 DIAGNOSIS — E785 Hyperlipidemia, unspecified: Secondary | ICD-10-CM

## 2021-06-30 DIAGNOSIS — Z1211 Encounter for screening for malignant neoplasm of colon: Secondary | ICD-10-CM

## 2021-06-30 DIAGNOSIS — Z23 Encounter for immunization: Secondary | ICD-10-CM

## 2021-06-30 DIAGNOSIS — Z0000 Encounter for general adult medical examination without abnormal findings: Secondary | ICD-10-CM | POA: Diagnosis not present

## 2021-06-30 DIAGNOSIS — R7303 Prediabetes: Secondary | ICD-10-CM

## 2021-06-30 MED ORDER — SHINGRIX 50 MCG/0.5ML IM SUSR: 0.5000 mL | Freq: Once | INTRAMUSCULAR | 0 refills | Status: AC

## 2021-06-30 NOTE — Assessment & Plan Note (Signed)
Routine HCM labs reviewed. HCM reviewed/discussed. Anticipatory guidance regarding healthy weight, lifestyle and choices given. ?Recommend healthy diet.  Recommend approximately 150 minutes/week of moderate intensity exercise ?Recommend regular dental and vision exams ?Always use seatbelt/lap and shoulder restraints ?Recommend using smoke alarms and checking batteries at least twice a year ?Recommend using sunscreen when outside ?Discussed colon cancer screening recommendations, options.  Patient would like to proceed with cologuard, order placed ?Discussed recommendations for shingles vaccine.  Patient amenable to this, Rx sent to pharmacy ?Discussed tetanus immunization recommendations, patient reports being UTD ?

## 2021-06-30 NOTE — Progress Notes (Signed)
?  Subjective:   ? ?CC: Annual Physical Exam ? ?HPI:  ?Nathan Benson is a 56 y.o. presenting for annual physical ? ?I reviewed the past medical history, family history, social history, surgical history, and allergies today and no changes were needed.  Please see the problem list section below in epic for further details. ? ?Past Medical History: ?Past Medical History:  ?Diagnosis Date  ? Hypertension   ? ?Past Surgical History: ?History reviewed. No pertinent surgical history. ?Social History: ?Social History  ? ?Socioeconomic History  ? Marital status: Single  ?  Spouse name: Not on file  ? Number of children: Not on file  ? Years of education: Not on file  ? Highest education level: Not on file  ?Occupational History  ? Not on file  ?Tobacco Use  ? Smoking status: Every Day  ?  Packs/day: 0.50  ?  Types: Cigarettes  ? Smokeless tobacco: Never  ?Vaping Use  ? Vaping Use: Never used  ?Substance and Sexual Activity  ? Alcohol use: No  ? Drug use: No  ? Sexual activity: Not on file  ?Other Topics Concern  ? Not on file  ?Social History Narrative  ? Not on file  ? ?Social Determinants of Health  ? ?Financial Resource Strain: Not on file  ?Food Insecurity: Not on file  ?Transportation Needs: Not on file  ?Physical Activity: Not on file  ?Stress: Not on file  ?Social Connections: Not on file  ? ?Family History: ?History reviewed. No pertinent family history. ?Allergies: ?No Known Allergies ?Medications: See med rec. ? ?Review of Systems: No headache, visual changes, nausea, vomiting, diarrhea, constipation, dizziness, abdominal pain, skin rash, fevers, chills, night sweats, swollen lymph nodes, weight loss, chest pain, body aches, joint swelling, muscle aches, shortness of breath, mood changes, visual or auditory hallucinations. ? ?Objective:   ? ?BP (!) 145/93   Pulse 81   Temp 97.8 ?F (36.6 ?C) (Oral)   Ht 5\' 4"  (1.626 m)   Wt 150 lb 3.2 oz (68.1 kg)   SpO2 100%   BMI 25.78 kg/m?  ? ?General: Well  Developed, well nourished, and in no acute distress.  ?Neuro: Alert and oriented x3, extra-ocular muscles intact, sensation grossly intact. Cranial nerves II through XII are intact, motor, sensory, and coordinative functions are all intact. ?HEENT: Normocephalic, atraumatic, pupils equal round reactive to light, neck supple, no masses, no lymphadenopathy, thyroid nonpalpable. Oropharynx, nasopharynx, external ear canals are unremarkable. ?Skin: Warm and dry, no rashes noted.  ?Cardiac: Regular rate and rhythm, no murmurs rubs or gallops.  ?Respiratory: Clear to auscultation bilaterally. Not using accessory muscles, speaking in full sentences.  ?Abdominal: Soft, nontender, nondistended, positive bowel sounds, no masses, no organomegaly.  ?Musculoskeletal: Shoulder, elbow, wrist, hip, knee, ankle stable, and with full range of motion. ? ?Impression and Recommendations:   ? ?No problem-specific Assessment & Plan notes found for this encounter. ? ? ? ? ?___________________________________________ ?Nathan Benson de , MD, ABFM, CAQSM ?Primary Care and Sports Medicine ?[facility] ?

## 2021-09-29 ENCOUNTER — Ambulatory Visit (HOSPITAL_BASED_OUTPATIENT_CLINIC_OR_DEPARTMENT_OTHER): Payer: 59 | Admitting: Family Medicine

## 2021-09-29 VITALS — BP 132/98 | HR 84 | Ht 64.0 in | Wt 145.3 lb

## 2021-09-29 DIAGNOSIS — I10 Essential (primary) hypertension: Secondary | ICD-10-CM

## 2021-09-29 DIAGNOSIS — R21 Rash and other nonspecific skin eruption: Secondary | ICD-10-CM | POA: Insufficient documentation

## 2021-09-29 MED ORDER — AMLODIPINE BESYLATE 5 MG PO TABS: 5.0000 mg | ORAL_TABLET | Freq: Every day | ORAL | 0 refills | Status: DC

## 2021-09-29 NOTE — Patient Instructions (Signed)

## 2021-09-29 NOTE — Progress Notes (Signed)
    Procedures performed today:    None.  Independent interpretation of notes and tests performed by another provider:   None.  Brief History, Exam, Impression, and Recommendations:    BP (!) 132/98   Pulse 84   Ht 5\' 4"  (1.626 m)   Wt 145 lb 4.8 oz (65.9 kg)   SpO2 99%   BMI 24.94 kg/m   Hypertension Blood pressure borderline in office today.  He continues with atenolol-chlorthalidone.  Denies any current issues with headaches, chest pain, lightheadedness or dizziness.  Does not check blood pressure regularly at home Given continued borderline elevation, feel that additional agent would be recommended, recommend initiating amlodipine 5 mg once daily Recommend intermittent monitoring at home, DASH diet Plan for close follow-up to monitor response  Rash He reports having rash noted over posterior aspect of right shoulder.  Thinks he might of got it from vegetation when leaning against a tree.  Has not had any significant itching or pain at the area.  Does report having steroid cream at home which he has applied topically, thinks it has provided some benefit.  Rash first noticed a few days ago, he does feel that it is improving On exam, there is an area of mild erythema over the right posterior shoulder, no significant swelling, warmth. Rash generally appears benign, suspect contact dermatitis.  Can continue with topical steroid cream and monitoring.  If rash does not continue to improve over the coming days, recommend returning to the office for further evaluation  Return in about 4 weeks (around 10/27/2021) for HTN.   ___________________________________________ Kamesha Herne de 10/29/2021, MD, ABFM, [facility] Primary Care and Sports Medicine [facility]

## 2021-09-30 NOTE — Assessment & Plan Note (Signed)
He reports having rash noted over posterior aspect of right shoulder.  Thinks he might of got it from vegetation when leaning against a tree.  Has not had any significant itching or pain at the area.  Does report having steroid cream at home which he has applied topically, thinks it has provided some benefit.  Rash first noticed a few days ago, he does feel that it is improving On exam, there is an area of mild erythema over the right posterior shoulder, no significant swelling, warmth. Rash generally appears benign, suspect contact dermatitis.  Can continue with topical steroid cream and monitoring.  If rash does not continue to improve over the coming days, recommend returning to the office for further evaluation

## 2021-09-30 NOTE — Assessment & Plan Note (Signed)
Blood pressure borderline in office today.  He continues with atenolol-chlorthalidone.  Denies any current issues with headaches, chest pain, lightheadedness or dizziness.  Does not check blood pressure regularly at home Given continued borderline elevation, feel that additional agent would be recommended, recommend initiating amlodipine 5 mg once daily Recommend intermittent monitoring at home, DASH diet Plan for close follow-up to monitor response

## 2021-10-27 ENCOUNTER — Ambulatory Visit (HOSPITAL_BASED_OUTPATIENT_CLINIC_OR_DEPARTMENT_OTHER): Payer: 59 | Admitting: Family Medicine

## 2021-11-02 ENCOUNTER — Ambulatory Visit (HOSPITAL_BASED_OUTPATIENT_CLINIC_OR_DEPARTMENT_OTHER): Payer: 59 | Admitting: Family Medicine

## 2021-11-02 ENCOUNTER — Encounter (HOSPITAL_BASED_OUTPATIENT_CLINIC_OR_DEPARTMENT_OTHER): Payer: Self-pay | Admitting: Family Medicine

## 2021-11-02 VITALS — BP 119/85 | HR 80 | Ht 64.0 in | Wt 140.0 lb

## 2021-11-02 DIAGNOSIS — I10 Essential (primary) hypertension: Secondary | ICD-10-CM

## 2021-11-02 NOTE — Patient Instructions (Signed)
  Medication Instructions:  Your physician recommends that you continue on your current medications as directed. Please refer to the Current Medication list given to you today. --If you need a refill on any your medications before your next appointment, please call your pharmacy first. If no refills are authorized on file call the office.-- Lab Work: Your physician has recommended that you have lab work today: No If you have labs (blood work) drawn today and your tests are completely normal, you will receive your results via MyChart message OR a phone call from our staff.  Please ensure you check your voicemail in the event that you authorized detailed messages to be left on a delegated number. If you have any lab test that is abnormal or we need to change your treatment, we will call you to review the results.  Referrals/Procedures/Imaging: No  Follow-Up: Your next appointment:   Your physician recommends that you schedule a follow-up appointment in: 3 months follow-up with Dr. de Cuba.  You will receive a text message or e-mail with a link to a survey about your care and experience with us today! We would greatly appreciate your feedback!   Thanks for letting us be apart of your health journey!!  Primary Care and Sports Medicine   Dr. Raymond de Cuba   We encourage you to activate your patient portal called "MyChart".  Sign up information is provided on this After Visit Summary.  MyChart is used to connect with patients for Virtual Visits (Telemedicine).  Patients are able to view lab/test results, encounter notes, upcoming appointments, etc.  Non-urgent messages can be sent to your provider as well. To learn more about what you can do with MyChart, please visit --  [email or web address].    

## 2021-11-02 NOTE — Assessment & Plan Note (Signed)
Blood pressure better controlled in office today.  He continues with atenolol-chlorthalidone as well as amlodipine which was added at last visit.  He does report that he occasionally misses a dose of amlodipine.  He has not had any issues with chest pain, headaches, lightheadedness or dizziness.  No issues with lower extremity edema. He is not checking his blood pressure at home Given improvement in blood pressure, feel like continuing with current medication regimen would be reasonable.  Patient also appears to be tolerating medication well. Recommend intermittent monitoring of blood pressure at home, DASH diet We will plan to follow-up in about 3 months to assess progress

## 2021-11-02 NOTE — Progress Notes (Signed)
    Procedures performed today:    None.  Independent interpretation of notes and tests performed by another provider:   None.  Brief History, Exam, Impression, and Recommendations:    BP 119/85   Pulse 80   Ht 5\' 4"  (1.626 m)   Wt 140 lb (63.5 kg)   SpO2 98%   BMI 24.03 kg/m   Hypertension Blood pressure better controlled in office today.  He continues with atenolol-chlorthalidone as well as amlodipine which was added at last visit.  He does report that he occasionally misses a dose of amlodipine.  He has not had any issues with chest pain, headaches, lightheadedness or dizziness.  No issues with lower extremity edema. He is not checking his blood pressure at home Given improvement in blood pressure, feel like continuing with current medication regimen would be reasonable.  Patient also appears to be tolerating medication well. Recommend intermittent monitoring of blood pressure at home, DASH diet We will plan to follow-up in about 3 months to assess progress  Return in about 3 months (around 02/02/2022) for HTN.  Further discuss hyperlipidemia and ASCVD risk score at next appointment   ___________________________________________ Nathan Benson de 02/04/2022, MD, ABFM, [facility] Primary Care and Sports Medicine [facility]

## 2021-11-27 ENCOUNTER — Other Ambulatory Visit (HOSPITAL_BASED_OUTPATIENT_CLINIC_OR_DEPARTMENT_OTHER): Payer: Self-pay | Admitting: Family Medicine

## 2021-11-27 DIAGNOSIS — I10 Essential (primary) hypertension: Secondary | ICD-10-CM

## 2022-01-02 ENCOUNTER — Other Ambulatory Visit (HOSPITAL_BASED_OUTPATIENT_CLINIC_OR_DEPARTMENT_OTHER): Payer: Self-pay | Admitting: Family Medicine

## 2022-01-02 DIAGNOSIS — I10 Essential (primary) hypertension: Secondary | ICD-10-CM

## 2022-02-02 ENCOUNTER — Ambulatory Visit (HOSPITAL_BASED_OUTPATIENT_CLINIC_OR_DEPARTMENT_OTHER): Payer: 59 | Admitting: Family Medicine

## 2022-02-09 ENCOUNTER — Encounter (HOSPITAL_BASED_OUTPATIENT_CLINIC_OR_DEPARTMENT_OTHER): Payer: Self-pay | Admitting: Family Medicine

## 2022-02-09 ENCOUNTER — Ambulatory Visit (INDEPENDENT_AMBULATORY_CARE_PROVIDER_SITE_OTHER): Payer: 59 | Admitting: Family Medicine

## 2022-02-09 VITALS — BP 120/84 | HR 80 | Temp 97.6°F | Ht 64.0 in | Wt 143.1 lb

## 2022-02-09 DIAGNOSIS — R21 Rash and other nonspecific skin eruption: Secondary | ICD-10-CM | POA: Diagnosis not present

## 2022-02-09 DIAGNOSIS — Z0000 Encounter for general adult medical examination without abnormal findings: Secondary | ICD-10-CM

## 2022-02-09 DIAGNOSIS — I10 Essential (primary) hypertension: Secondary | ICD-10-CM

## 2022-02-09 MED ORDER — TRIAMCINOLONE ACETONIDE 0.1 % EX CREA: 1.0000 | TOPICAL_CREAM | Freq: Two times a day (BID) | CUTANEOUS | 0 refills | Status: DC

## 2022-02-09 NOTE — Assessment & Plan Note (Signed)
Blood pressure borderline in office today, systolic is at goal, diastolic slightly elevated.  Blood pressure did improve on recheck.  He does not check his blood pressure regularly at home.  Most recent office visit did show reasonable control after addition of amlodipine.  He does continue with amlodipine as well as atenolol-chlorthalidone, does not need refills of medications today.  Denies any issues with chest pain or headaches. Given better control of blood pressure with current medication regimen, do not feel that changes are needed today.  We can continue with current regimen and we will plan for annual physical in about 4 to 6 months and will update labs around that time Recommend intermittent monitoring of blood pressure at home, DASH diet

## 2022-02-09 NOTE — Patient Instructions (Signed)
  Medication Instructions:  Your physician recommends that you continue on your current medications as directed. Please refer to the Current Medication list given to you today. --If you need a refill on any your medications before your next appointment, please call your pharmacy first. If no refills are authorized on file call the office.-- Lab Work: Your physician has recommended that you have lab work today: No If you have labs (blood work) drawn today and your tests are completely normal, you will receive your results via MyChart message OR a phone call from our staff.  Please ensure you check your voicemail in the event that you authorized detailed messages to be left on a delegated number. If you have any lab test that is abnormal or we need to change your treatment, we will call you to review the results.  Referrals/Procedures/Imaging: No  Follow-Up: Your next appointment:   Your physician recommends that you schedule a follow-up appointment in: 4-6 months with Dr. de Cuba.  You will receive a text message or e-mail with a link to a survey about your care and experience with us today! We would greatly appreciate your feedback!   Thanks for letting us be apart of your health journey!!  Primary Care and Sports Medicine   Dr. Raymond de Cuba   We encourage you to activate your patient portal called "MyChart".  Sign up information is provided on this After Visit Summary.  MyChart is used to connect with patients for Virtual Visits (Telemedicine).  Patients are able to view lab/test results, encounter notes, upcoming appointments, etc.  Non-urgent messages can be sent to your provider as well. To learn more about what you can do with MyChart, please visit --  [email or web address].    

## 2022-02-09 NOTE — Progress Notes (Signed)
    Procedures performed today:    None.  Independent interpretation of notes and tests performed by another provider:   None.  Brief History, Exam, Impression, and Recommendations:    BP (!) 117/96 (BP Location: Left Arm, Patient Position: Sitting, Cuff Size: Large)   Pulse 80   Temp 97.6 F (36.4 C) (Oral)   Ht 5\' 4"  (1.626 m)   Wt 143 lb 1.6 oz (64.9 kg)   SpO2 100%   BMI 24.56 kg/m   Hypertension Blood pressure borderline in office today, systolic is at goal, diastolic slightly elevated.  Blood pressure did improve on recheck.  He does not check his blood pressure regularly at home.  Most recent office visit did show reasonable control after addition of amlodipine.  He does continue with amlodipine as well as atenolol-chlorthalidone, does not need refills of medications today.  Denies any issues with chest pain or headaches. Given better control of blood pressure with current medication regimen, do not feel that changes are needed today.  We can continue with current regimen and we will plan for annual physical in about 4 to 6 months and will update labs around that time Recommend intermittent monitoring of blood pressure at home, DASH diet  Rash Patient reports that he has had a rash in the area where his belt will come into contact with his lower abdomen.  He thinks it may be related to sensitivity to nickel and belt buckle.  He has had prior similar issues in the past which have responded well to steroid cream, thus he is requesting new prescription for this today We can proceed with use of topical steroid cream to treat contact dermatitis.  Discussed importance of avoiding exposure to offending agents, switching to alternative belts to eliminate exposure, patient voiced understanding  Return in about 4 months (around 06/11/2022) for CPE with [facility]W a few days prior.   ___________________________________________ Tyrick Dunagan de 08/11/2022, MD, ABFM, CAQSM Primary Care and Sports  Medicine [facility]

## 2022-02-09 NOTE — Assessment & Plan Note (Signed)
Patient reports that he has had a rash in the area where his belt will come into contact with his lower abdomen.  He thinks it may be related to sensitivity to nickel and belt buckle.  He has had prior similar issues in the past which have responded well to steroid cream, thus he is requesting new prescription for this today We can proceed with use of topical steroid cream to treat contact dermatitis.  Discussed importance of avoiding exposure to offending agents, switching to alternative belts to eliminate exposure, patient voiced understanding

## 2022-04-08 ENCOUNTER — Other Ambulatory Visit (HOSPITAL_BASED_OUTPATIENT_CLINIC_OR_DEPARTMENT_OTHER): Payer: Self-pay | Admitting: Family Medicine

## 2022-04-08 DIAGNOSIS — I10 Essential (primary) hypertension: Secondary | ICD-10-CM

## 2022-05-17 ENCOUNTER — Encounter (HOSPITAL_BASED_OUTPATIENT_CLINIC_OR_DEPARTMENT_OTHER): Payer: Self-pay

## 2022-06-06 ENCOUNTER — Encounter (HOSPITAL_BASED_OUTPATIENT_CLINIC_OR_DEPARTMENT_OTHER): Payer: Self-pay | Admitting: Family Medicine

## 2022-06-06 ENCOUNTER — Ambulatory Visit (HOSPITAL_BASED_OUTPATIENT_CLINIC_OR_DEPARTMENT_OTHER): Payer: 59

## 2022-06-06 ENCOUNTER — Other Ambulatory Visit (HOSPITAL_BASED_OUTPATIENT_CLINIC_OR_DEPARTMENT_OTHER): Payer: Self-pay | Admitting: Family Medicine

## 2022-06-06 DIAGNOSIS — I10 Essential (primary) hypertension: Secondary | ICD-10-CM

## 2022-06-07 LAB — CBC WITH DIFFERENTIAL/PLATELET
Basophils Absolute: 0.1 10*3/uL (ref 0.0–0.2)
Basos: 1 %
EOS (ABSOLUTE): 0.1 10*3/uL (ref 0.0–0.4)
Eos: 2 %
Hematocrit: 42.7 % (ref 37.5–51.0)
Hemoglobin: 14.5 g/dL (ref 13.0–17.7)
Immature Grans (Abs): 0 10*3/uL (ref 0.0–0.1)
Immature Granulocytes: 0 %
Lymphocytes Absolute: 2.9 10*3/uL (ref 0.7–3.1)
Lymphs: 41 %
MCH: 28.3 pg (ref 26.6–33.0)
MCHC: 34 g/dL (ref 31.5–35.7)
MCV: 83 fL (ref 79–97)
Monocytes Absolute: 0.5 10*3/uL (ref 0.1–0.9)
Monocytes: 8 %
Neutrophils Absolute: 3.5 10*3/uL (ref 1.4–7.0)
Neutrophils: 48 %
Platelets: 281 10*3/uL (ref 150–450)
RBC: 5.12 x10E6/uL (ref 4.14–5.80)
RDW: 12.5 % (ref 11.6–15.4)
WBC: 7.1 10*3/uL (ref 3.4–10.8)

## 2022-06-07 LAB — COMPREHENSIVE METABOLIC PANEL
ALT: 31 IU/L (ref 0–44)
AST: 27 IU/L (ref 0–40)
Albumin/Globulin Ratio: 2 (ref 1.2–2.2)
Albumin: 4.6 g/dL (ref 3.8–4.9)
Alkaline Phosphatase: 67 IU/L (ref 44–121)
BUN/Creatinine Ratio: 15 (ref 9–20)
BUN: 16 mg/dL (ref 6–24)
Bilirubin Total: 0.6 mg/dL (ref 0.0–1.2)
CO2: 23 mmol/L (ref 20–29)
Calcium: 9.8 mg/dL (ref 8.7–10.2)
Chloride: 100 mmol/L (ref 96–106)
Creatinine, Ser: 1.1 mg/dL (ref 0.76–1.27)
Globulin, Total: 2.3 g/dL (ref 1.5–4.5)
Glucose: 89 mg/dL (ref 70–99)
Potassium: 4 mmol/L (ref 3.5–5.2)
Sodium: 141 mmol/L (ref 134–144)
Total Protein: 6.9 g/dL (ref 6.0–8.5)
eGFR: 80 mL/min/{1.73_m2} (ref >59)

## 2022-06-07 LAB — LIPID PANEL
Chol/HDL Ratio: 3.5 ratio (ref 0.0–5.0)
Cholesterol, Total: 185 mg/dL (ref 100–199)
HDL: 53 mg/dL (ref >39)
LDL Chol Calc (NIH): 104 mg/dL — ABNORMAL HIGH (ref 0–99)
Triglycerides: 160 mg/dL — ABNORMAL HIGH (ref 0–149)
VLDL Cholesterol Cal: 28 mg/dL (ref 5–40)

## 2022-06-07 LAB — HEMOGLOBIN A1C
Est. average glucose Bld gHb Est-mCnc: 120 mg/dL
Hgb A1c MFr Bld: 5.8 % — ABNORMAL HIGH (ref 4.8–5.6)

## 2022-06-07 LAB — TSH RFX ON ABNORMAL TO FREE T4: TSH: 1.42 u[IU]/mL (ref 0.450–4.500)

## 2022-06-13 ENCOUNTER — Encounter (HOSPITAL_BASED_OUTPATIENT_CLINIC_OR_DEPARTMENT_OTHER): Payer: Self-pay | Admitting: Family Medicine

## 2022-06-13 ENCOUNTER — Ambulatory Visit (INDEPENDENT_AMBULATORY_CARE_PROVIDER_SITE_OTHER): Payer: 59 | Admitting: Family Medicine

## 2022-06-13 ENCOUNTER — Encounter (HOSPITAL_BASED_OUTPATIENT_CLINIC_OR_DEPARTMENT_OTHER): Payer: 59 | Admitting: Family Medicine

## 2022-06-13 VITALS — BP 122/95 | HR 70 | Temp 97.6°F | Ht 64.0 in | Wt 145.1 lb

## 2022-06-13 DIAGNOSIS — E785 Hyperlipidemia, unspecified: Secondary | ICD-10-CM | POA: Diagnosis not present

## 2022-06-13 DIAGNOSIS — Z23 Encounter for immunization: Secondary | ICD-10-CM

## 2022-06-13 DIAGNOSIS — Z0000 Encounter for general adult medical examination without abnormal findings: Secondary | ICD-10-CM

## 2022-06-13 NOTE — Progress Notes (Signed)
Complete physical exam  Patient: Nathan Benson   DOB: [birth year]   57 y.o. Male  MRN: [id number]  Subjective:    Chief Complaint  Patient presents with   Annual Exam    Pt here for annual exam     Nathan Benson is a 57 y.o. male who presents today for a complete physical exam. He reports consuming a general diet. He reports that he cut down on bread and has been eating more green vegetables. The patient does not participate in regular exercise at present. Lift weights occasionally. He has a physical labor job. He generally feels well. He reports sleeping well. He does not have additional problems to discuss today.    I reviewed the past medical history, family history, social history, surgical history, and allergies today and no changes were needed.  Please see the problem list section below in epic for further details.    Depression screenings:    06/13/2022    3:33 PM 02/09/2022    3:06 PM 11/02/2021    2:01 PM  Depression screen PHQ 2/9  Decreased Interest 0 0 0  Down, Depressed, Hopeless 0 0 0  PHQ - 2 Score 0 0 0  Altered sleeping  0 0  Tired, decreased energy  0 0  Change in appetite  0 0  Feeling bad or failure about yourself   0 0  Trouble concentrating  0 0  Moving slowly or fidgety/restless  0 0  Suicidal thoughts  0 0  PHQ-9 Score  0 0  Difficult doing work/chores  Not difficult at all Not difficult at all    Anxiety screenings:    06/13/2022    3:33 PM 02/09/2022    3:06 PM  GAD 7 : Generalized Anxiety Score  Nervous, Anxious, on Edge 0 0  Control/stop worrying 0 0  Worry too much - different things 0 0  Trouble relaxing 0 0  Restless 0 0  Easily annoyed or irritable 0 0  Afraid - awful might happen 0 0  Total GAD 7 Score 0 0  Anxiety Difficulty Not difficult at all Not difficult at all   Dentist: has dental insurance now, plans to schedule  Vision & hearing test done at work annually   Patient Care Team: [place], Buren Kos, MD as PCP  - General (Family Medicine)   Outpatient Medications Prior to Visit  Medication Sig   amLODipine (NORVASC) 5 MG tablet TAKE 1 TABLET (5 MG TOTAL) BY MOUTH DAILY.   atenolol-chlorthalidone (TENORETIC) 50-25 MG tablet TAKE 1 TABLET BY MOUTH EVERY DAY   triamcinolone cream (KENALOG) 0.1 % Apply 1 Application topically 2 (two) times daily.   No facility-administered medications prior to visit.    Review of Systems  Constitutional:  Negative for malaise/fatigue.  Eyes:  Negative for blurred vision and double vision.  Respiratory:  Negative for cough and shortness of breath.   Cardiovascular:  Negative for chest pain and palpitations.  Gastrointestinal:  Negative for abdominal pain, nausea and vomiting.  Musculoskeletal:  Negative for myalgias.  Neurological:  Negative for dizziness, weakness and headaches.  Psychiatric/Behavioral:  Negative for depression and suicidal ideas.       Objective:    BP (!) 122/95 (BP Location: Left Arm, Patient Position: Sitting, Cuff Size: Normal)   Pulse 70   Temp 97.6 F (36.4 C) (Oral)   Ht 5\' 4"  (1.626 m)   Wt 145 lb 1.6 oz (65.8 kg)   SpO2 100%  BMI 24.91 kg/m  BP Readings from Last 3 Encounters:  06/13/22 (!) 122/95  02/09/22 120/84  11/02/21 119/85   The 10-year ASCVD risk score (Arnett DK, et al., 2019) is: 15.5%   Values used to calculate the score:     Age: 30 years     Sex: Male     Is Non-Hispanic African American: Yes     Diabetic: No     Tobacco smoker: Yes     Systolic Blood Pressure: 122 mmHg     Is BP treated: Yes     HDL Cholesterol: 53 mg/dL     Total Cholesterol: 185 mg/dL   Physical Exam Constitutional:      Appearance: Normal appearance.  HENT:     Right Ear: Tympanic membrane, ear canal and external ear normal.     Left Ear: Tympanic membrane, ear canal and external ear normal.     Mouth/Throat:     Mouth: Mucous membranes are moist.     Pharynx: Oropharynx is clear.  Eyes:     Extraocular Movements:  Extraocular movements intact.     Pupils: Pupils are equal, round, and reactive to light.  Cardiovascular:     Rate and Rhythm: Normal rate and regular rhythm.     Pulses: Normal pulses.     Heart sounds: Normal heart sounds.  Pulmonary:     Effort: Pulmonary effort is normal.     Breath sounds: Normal breath sounds.  Abdominal:     General: Abdomen is flat. Bowel sounds are normal.     Palpations: Abdomen is soft.  Musculoskeletal:        General: Normal range of motion.  Skin:    General: Skin is warm and dry.  Neurological:     Mental Status: He is alert.  Psychiatric:        Attention and Perception: Attention normal.        Mood and Affect: Mood normal.        Behavior: Behavior normal.        Thought Content: Thought content normal.        Judgment: Judgment normal.    Assessment & Plan:    Routine Health Maintenance and Physical Exam  Health Maintenance  Topic Date Due   HIV Screening  Never done   Hepatitis C Screening: USPSTF Recommendation to screen - Ages 52-79 yo.  Never done   Cologuard (Stool DNA test)  Never done   Zoster (Shingles) Vaccine (1 of 2) Never done   COVID-19 Vaccine (2 - 2023-24 season) 11/04/2021   Flu Shot  10/05/2022   DTaP/Tdap/Td vaccine (2 - Td or Tdap) 06/12/2032   HPV Vaccine  Aged Out   1. Wellness examination Routine HCM labs ordered. Labs reviewed/discussed today.  Review of PMH, FH, SH, medications and HM performed.  Recommend healthy diet.  Recommend approximately 150 minutes/week of moderate intensity exercise. Recommend regular dental and vision exams. Always use seatbelt/lap and shoulder restraints. Recommend using smoke alarms and checking batteries at least twice a year. Recommend using sunscreen when outside. Discussed immunization recommendations for shingles and tetanus vaccines. Patient agreeable to Tdap today.   Discussed colon cancer screening recommendations and options. Noted on review of chart, order was placed  for Cologuard last year on 06/30/21. Advised him that it still good until one year from the order date. Advised him to complete by 07/01/22.   2. Hyperlipidemia, unspecified hyperlipidemia type Reviewed ASCVD risk with patient and recommendations for initiation of statin  therapy to decrease cholesterol levels and decrease the risk of heart attack or stroke. Patient verbalized an understanding of his risk. Discussed medication side effects, importance of medication adherence, and frequency of monitoring of labs. He would like to focus on healthy diet at this time and reassess his ASCVD risk at a follow-up appt in 3 months.   Return in about 3 months (around 09/12/2022) for HTN & HLD follow-up.     Alyson ReedyKristina  Maycen Degregory, FNP

## 2022-07-06 ENCOUNTER — Other Ambulatory Visit (HOSPITAL_BASED_OUTPATIENT_CLINIC_OR_DEPARTMENT_OTHER): Payer: Self-pay

## 2022-07-06 DIAGNOSIS — I10 Essential (primary) hypertension: Secondary | ICD-10-CM

## 2022-07-06 MED ORDER — AMLODIPINE BESYLATE 5 MG PO TABS: 5.0000 mg | ORAL_TABLET | Freq: Every day | ORAL | 2 refills | Status: DC

## 2022-09-13 ENCOUNTER — Ambulatory Visit (HOSPITAL_BASED_OUTPATIENT_CLINIC_OR_DEPARTMENT_OTHER): Payer: 59 | Admitting: Family Medicine

## 2022-09-15 ENCOUNTER — Telehealth: Payer: Self-pay | Admitting: *Deleted

## 2022-09-15 NOTE — Telephone Encounter (Signed)
Called to offer colon cancer screening. Pt advised on options of cologuard and colonoscopy. Pt has the cologuard kit at home but this is over a year old. He will come in for office visit 7/23 and discuss with Dr Tommi Rumps [place] at that time.

## 2022-09-26 ENCOUNTER — Ambulatory Visit (INDEPENDENT_AMBULATORY_CARE_PROVIDER_SITE_OTHER): Payer: 59 | Admitting: Family Medicine

## 2022-09-26 ENCOUNTER — Encounter (HOSPITAL_BASED_OUTPATIENT_CLINIC_OR_DEPARTMENT_OTHER): Payer: Self-pay | Admitting: Family Medicine

## 2022-09-26 VITALS — BP 114/85 | HR 90 | Ht 64.0 in | Wt 142.0 lb

## 2022-09-26 DIAGNOSIS — F17200 Nicotine dependence, unspecified, uncomplicated: Secondary | ICD-10-CM

## 2022-09-26 DIAGNOSIS — I10 Essential (primary) hypertension: Secondary | ICD-10-CM

## 2022-09-26 DIAGNOSIS — Z1211 Encounter for screening for malignant neoplasm of colon: Secondary | ICD-10-CM | POA: Diagnosis not present

## 2022-09-26 NOTE — Patient Instructions (Signed)
  Medication Instructions:  Your physician recommends that you continue on your current medications as directed. Please refer to the Current Medication list given to you today. --If you need a refill on any your medications before your next appointment, please call your pharmacy first. If no refills are authorized on file call the office.-- Lab Work: Your physician has recommended that you have lab work today: No If you have labs (blood work) drawn today and your tests are completely normal, you will receive your results via [place] a phone call from our staff.  Please ensure you check your voicemail in the event that you authorized detailed messages to be left on a delegated number. If you have any lab test that is abnormal or we need to change your treatment, we will call you to review the results.  Referrals/Procedures/Imaging: No  Follow-Up: Your next appointment:   Your physician recommends that you schedule a follow-up appointment in: 3-4 months with Dr. de [place].  You will receive a text message or e-mail with a link to a survey about your care and experience with [place] today! We would greatly appreciate your feedback!   Thanks for letting us be apart of your health journey!!  Primary Care and Sports Medicine   Dr. Arlina Robes [place]   We encourage you to activate your patient portal called "MyChart".  Sign up information is provided on this After Visit Summary.  MyChart is used to connect with patients for Virtual Visits (Telemedicine).  Patients are able to view lab/test results, encounter notes, upcoming appointments, etc.  Non-urgent messages can be sent to your provider as well. To learn more about what you can do with MyChart, please visit --  [email or web address].

## 2022-09-26 NOTE — Assessment & Plan Note (Signed)
Patient currently smoking about half pack per day, has been doing so for the past 8 years.  He did quit for about 7 years prior to this, indicates that he quit smoking cold [place].  Prior smoking history was about 5 years of half a pack during that time as well. He does have some interest in quitting We discussed options for quitting including cold [place], nicotine replacement therapy, pharmacotherapy such as Chantix.  We discussed potential risks and side effects related to pharmacotherapy Patient will consider options and we will continue to monitor readiness to quit at future office visits.

## 2022-09-26 NOTE — Assessment & Plan Note (Signed)
Blood pressure borderline in office today, did improve on recheck.  Continues with medications as prescribed.  No current issues with chest pain, headaches, vision changes. Not checking blood pressure regularly at home. Recommend continue with current medication regimen, no changes to medications today Recommend intermittent monitoring of blood pressure at home, DASH diet

## 2022-09-26 NOTE — Progress Notes (Signed)
    Procedures performed today:    None.  Independent interpretation of notes and tests performed by another provider:   None.  Brief History, Exam, Impression, and Recommendations:    BP 114/85   Pulse 90   Ht 5\' 4"  (1.626 m)   Wt 142 lb (64.4 kg)   SpO2 99%   BMI 24.37 kg/m   Colon cancer screening -     Cologuard  Primary hypertension Assessment & Plan: Blood pressure borderline in office today, did improve on recheck.  Continues with medications as prescribed.  No current issues with chest pain, headaches, vision changes. Not checking blood pressure regularly at home. Recommend continue with current medication regimen, no changes to medications today Recommend intermittent monitoring of blood pressure at home, DASH diet   Tobacco use disorder Assessment & Plan: Patient currently smoking about half pack per day, has been doing so for the past 8 years.  He did quit for about 7 years prior to this, indicates that he quit smoking cold [place].  Prior smoking history was about 5 years of half a pack during that time as well. He does have some interest in quitting We discussed options for quitting including cold [place], nicotine replacement therapy, pharmacotherapy such as Chantix.  We discussed potential risks and side effects related to pharmacotherapy Patient will consider options and we will continue to monitor readiness to quit at future office visits.   Previously ordered Cologuard for patient to complete, it has been a little more than 1 year since last ordered, patient did not complete Cologuard at that time.  We again discussed importance of colon cancer screening, he would be amenable to proceeding with Cologuard again, new order placed  Return in about 3 months (around 12/27/2022) for hypertension, tobacco use.  Spent 32 minutes on this patient encounter, including preparation, chart review, face-to-face counseling with patient and coordination of care, and  documentation of encounter   ___________________________________________ Raaga Maeder [place], MD, ABFM, [facility] Primary Care and Sports Medicine [facility]

## 2022-11-20 ENCOUNTER — Other Ambulatory Visit (HOSPITAL_BASED_OUTPATIENT_CLINIC_OR_DEPARTMENT_OTHER): Payer: Self-pay | Admitting: Family Medicine

## 2022-11-20 DIAGNOSIS — I10 Essential (primary) hypertension: Secondary | ICD-10-CM

## 2022-12-08 ENCOUNTER — Other Ambulatory Visit (HOSPITAL_BASED_OUTPATIENT_CLINIC_OR_DEPARTMENT_OTHER): Payer: Self-pay | Admitting: Family Medicine

## 2022-12-11 MED ORDER — TRIAMCINOLONE ACETONIDE 0.1 % EX CREA: 1.0000 | TOPICAL_CREAM | Freq: Two times a day (BID) | CUTANEOUS | 0 refills | Status: DC

## 2022-12-28 ENCOUNTER — Ambulatory Visit (HOSPITAL_BASED_OUTPATIENT_CLINIC_OR_DEPARTMENT_OTHER): Payer: 59 | Admitting: Family Medicine

## 2022-12-28 ENCOUNTER — Encounter (HOSPITAL_BASED_OUTPATIENT_CLINIC_OR_DEPARTMENT_OTHER): Payer: Self-pay | Admitting: Family Medicine

## 2022-12-28 VITALS — BP 136/84 | HR 94 | Ht 64.0 in | Wt 145.0 lb

## 2022-12-28 DIAGNOSIS — I10 Essential (primary) hypertension: Secondary | ICD-10-CM | POA: Diagnosis not present

## 2022-12-28 DIAGNOSIS — F17200 Nicotine dependence, unspecified, uncomplicated: Secondary | ICD-10-CM

## 2022-12-28 MED ORDER — VARENICLINE TARTRATE 1 MG PO TABS: ORAL_TABLET | ORAL | 0 refills | Status: AC

## 2022-12-28 NOTE — Progress Notes (Signed)
    Procedures performed today:    None.  Independent interpretation of notes and tests performed by another provider:   None.  Brief History, Exam, Impression, and Recommendations:    BP 136/84 (BP Location: Right Arm, Patient Position: Sitting, Cuff Size: Normal)   Pulse 94   Ht 5\' 4"  (1.626 m)   Wt 145 lb (65.8 kg)   SpO2 95%   BMI 24.89 kg/m   Primary hypertension Assessment & Plan: Blood pressure borderline in office today.  Continues with medications as prescribed.  No current issues with chest pain, headaches, vision changes. Not checking blood pressure regularly at home. Recommend continue with current medication regimen, no changes to medications today Recommend intermittent monitoring of blood pressure at home, DASH diet   Tobacco use disorder Assessment & Plan: Patient currently smoking about half pack per day, has been doing so for the past 8 years.  He did quit for about 7 years prior to this, indicates that he quit smoking cold [place].  Prior smoking history was about 5 years of half a pack during that time as well. He does have some interest in quitting - we again reviewed options to assist with smoking cessation.  After discussion, patient like to proceed with Chantix.  We discussed potential risks and side effects related to pharmacotherapy.  We discussed starting medication about 1 week prior to quit date with gradual titration. We will plan to follow-up in about 6 weeks to assess progress with medication   Other orders -     Varenicline Tartrate; Take 0.5 tablets (0.5 mg total) by mouth daily for 3 days, THEN 0.5 tablets (0.5 mg total) 2 (two) times daily for 4 days, THEN 1 tablet (1 mg total) 2 (two) times daily for 28 days. Start medication 1 week before planned quit date..  Dispense: 62 tablet; Refill: 0  Return in about 4 weeks (around 01/25/2023) for med check.   ___________________________________________ Kanyah Matsushima [place], MD, ABFM, [facility] Primary  Care and Sports Medicine [facility]

## 2022-12-29 NOTE — Assessment & Plan Note (Signed)
Patient currently smoking about half pack per day, has been doing so for the past 8 years.  He did quit for about 7 years prior to this, indicates that he quit smoking cold [place].  Prior smoking history was about 5 years of half a pack during that time as well. He does have some interest in quitting - we again reviewed options to assist with smoking cessation.  After discussion, patient like to proceed with Chantix.  We discussed potential risks and side effects related to pharmacotherapy.  We discussed starting medication about 1 week prior to quit date with gradual titration. We will plan to follow-up in about 6 weeks to assess progress with medication

## 2022-12-29 NOTE — Assessment & Plan Note (Signed)
Blood pressure borderline in office today.  Continues with medications as prescribed.  No current issues with chest pain, headaches, vision changes. Not checking blood pressure regularly at home. Recommend continue with current medication regimen, no changes to medications today Recommend intermittent monitoring of blood pressure at home, DASH diet

## 2023-01-03 LAB — COLOGUARD: COLOGUARD: NEGATIVE

## 2023-01-25 ENCOUNTER — Encounter (HOSPITAL_BASED_OUTPATIENT_CLINIC_OR_DEPARTMENT_OTHER): Payer: Self-pay | Admitting: Family Medicine

## 2023-02-09 ENCOUNTER — Ambulatory Visit (HOSPITAL_BASED_OUTPATIENT_CLINIC_OR_DEPARTMENT_OTHER): Payer: 59 | Admitting: Family Medicine

## 2023-02-09 ENCOUNTER — Encounter (HOSPITAL_BASED_OUTPATIENT_CLINIC_OR_DEPARTMENT_OTHER): Payer: Self-pay | Admitting: Family Medicine

## 2023-02-09 VITALS — BP 102/92 | HR 78 | Temp 98.4°F | Ht 64.0 in | Wt 142.9 lb

## 2023-02-09 DIAGNOSIS — F17200 Nicotine dependence, unspecified, uncomplicated: Secondary | ICD-10-CM

## 2023-02-09 NOTE — Assessment & Plan Note (Addendum)
Previously, we had look to start Chantix, however patient indicates that he has not started taking medication yet.  He indicates that he was worried about returning to smoking around the holidays and thus did not proceed with cessation.  He denies any specific questions or concerns related to the medication itself.  He indicates that he had not picked up medication from pharmacy. We again reviewed medication, patient does not have any specific questions or concerns in this regard.  He still has interest in starting medication, however we will look to do so in the future.  He anticipates quitting smoking on January 1.  Advised again on starting medication about 1 week prior to planned quit date with gradual titrations as according to instructions on prescription.  Discussed that we would typically follow-up a few weeks later to assess progress with medication and if doing well with smoking cessation and tolerating medication, we can continue with medication for at least 12 weeks and possibly longer depending on progress

## 2023-02-09 NOTE — Progress Notes (Signed)
    Procedures performed today:    None.  Independent interpretation of notes and tests performed by another provider:   None.  Brief History, Exam, Impression, and Recommendations:    BP (!) 102/92 (BP Location: Right Arm, Patient Position: Sitting, Cuff Size: Normal)   Pulse 78   Temp 98.4 F (36.9 C) (Oral)   Ht 5\' 4"  (1.626 m)   Wt 142 lb 14.4 oz (64.8 kg)   SpO2 97%   BMI 24.53 kg/m   Tobacco use disorder Assessment & Plan: Previously, we had look to start Chantix, however patient indicates that he has not started taking medication yet.  He indicates that he was worried about returning to smoking around the holidays and thus did not proceed with cessation.  He denies any specific questions or concerns related to the medication itself.  He indicates that he had not picked up medication from pharmacy. We again reviewed medication, patient does not have any specific questions or concerns in this regard.  He still has interest in starting medication, however we will look to do so in the future.  He anticipates quitting smoking on January 1.  Advised again on starting medication about 1 week prior to planned quit date with gradual titrations as according to instructions on prescription.  Discussed that we would typically follow-up a few weeks later to assess progress with medication and if doing well with smoking cessation and tolerating medication, we can continue with medication for at least 12 weeks and possibly longer depending on progress   Return in about 6 weeks (around 03/23/2023) for med check.   ___________________________________________ Nathan Harbaugh de [place], MD, ABFM, [facility] Primary Care and Sports Medicine [facility]

## 2023-03-29 ENCOUNTER — Ambulatory Visit (HOSPITAL_BASED_OUTPATIENT_CLINIC_OR_DEPARTMENT_OTHER): Payer: 59 | Admitting: Family Medicine

## 2023-05-10 ENCOUNTER — Other Ambulatory Visit (HOSPITAL_BASED_OUTPATIENT_CLINIC_OR_DEPARTMENT_OTHER): Payer: Self-pay | Admitting: Family Medicine

## 2023-05-10 DIAGNOSIS — I10 Essential (primary) hypertension: Secondary | ICD-10-CM

## 2023-07-08 ENCOUNTER — Other Ambulatory Visit (HOSPITAL_BASED_OUTPATIENT_CLINIC_OR_DEPARTMENT_OTHER): Payer: Self-pay | Admitting: Family Medicine

## 2023-07-08 DIAGNOSIS — I10 Essential (primary) hypertension: Secondary | ICD-10-CM

## 2023-08-30 ENCOUNTER — Other Ambulatory Visit (HOSPITAL_BASED_OUTPATIENT_CLINIC_OR_DEPARTMENT_OTHER): Payer: Self-pay | Admitting: Family Medicine

## 2023-08-30 ENCOUNTER — Encounter (HOSPITAL_BASED_OUTPATIENT_CLINIC_OR_DEPARTMENT_OTHER): Payer: Self-pay | Admitting: *Deleted

## 2023-08-30 DIAGNOSIS — I10 Essential (primary) hypertension: Secondary | ICD-10-CM

## 2023-09-10 ENCOUNTER — Encounter (HOSPITAL_BASED_OUTPATIENT_CLINIC_OR_DEPARTMENT_OTHER): Payer: Self-pay | Admitting: Family Medicine

## 2023-09-10 ENCOUNTER — Ambulatory Visit (HOSPITAL_BASED_OUTPATIENT_CLINIC_OR_DEPARTMENT_OTHER): Admitting: Family Medicine

## 2023-09-10 VITALS — BP 110/96 | HR 80 | Ht 64.0 in | Wt 141.0 lb

## 2023-09-10 DIAGNOSIS — R21 Rash and other nonspecific skin eruption: Secondary | ICD-10-CM

## 2023-09-10 DIAGNOSIS — L729 Follicular cyst of the skin and subcutaneous tissue, unspecified: Secondary | ICD-10-CM | POA: Insufficient documentation

## 2023-09-10 DIAGNOSIS — I10 Essential (primary) hypertension: Secondary | ICD-10-CM

## 2023-09-10 DIAGNOSIS — F17200 Nicotine dependence, unspecified, uncomplicated: Secondary | ICD-10-CM

## 2023-09-10 DIAGNOSIS — Z0000 Encounter for general adult medical examination without abnormal findings: Secondary | ICD-10-CM

## 2023-09-10 MED ORDER — AMLODIPINE BESYLATE 5 MG PO TABS: 5.0000 mg | ORAL_TABLET | Freq: Every day | ORAL | 2 refills | Status: AC

## 2023-09-10 MED ORDER — ATENOLOL-CHLORTHALIDONE 50-25 MG PO TABS: 1.0000 | ORAL_TABLET | Freq: Every day | ORAL | 1 refills | Status: AC

## 2023-09-10 MED ORDER — TRIAMCINOLONE ACETONIDE 0.1 % EX CREA: 1.0000 | TOPICAL_CREAM | Freq: Two times a day (BID) | CUTANEOUS | 0 refills | Status: DC

## 2023-09-10 NOTE — Patient Instructions (Signed)
  Medication Instructions:  Your physician recommends that you continue on your current medications as directed. Please refer to the Current Medication list given to you today. --If you need a refill on any your medications before your next appointment, please call your pharmacy first. If no refills are authorized on file call the office.-- Lab Work: Your physician has recommended that you have lab work today: 1 week before next visit  If you have labs (blood work) drawn today and your tests are completely normal, you will receive your results via MyChart message OR a phone call from our staff.  Please ensure you check your voicemail in the event that you authorized detailed messages to be left on a delegated number. If you have any lab test that is abnormal or we need to change your treatment, we will call you to review the results.  Follow-Up: Your next appointment:   Your physician recommends that you schedule a follow-up appointment in: 3-4 months physical with Dr. de [place]  You will receive a text message or e-mail with a link to a survey about your care and experience with [place] today! We would greatly appreciate your feedback!   Thanks for letting us be apart of your health journey!!  Primary Care and Sports Medicine   Dr. Ceasar Mons [place]   We encourage you to activate your patient portal called "MyChart".  Sign up information is provided on this After Visit Summary.  MyChart is used to connect with patients for Virtual Visits (Telemedicine).  Patients are able to view lab/test results, encounter notes, upcoming appointments, etc.  Non-urgent messages can be sent to your provider as well. To learn more about what you can do with MyChart, please visit --  [email or web address].

## 2023-09-10 NOTE — Assessment & Plan Note (Signed)
 Patient reports having small bumps over her posterior scalp.  Thinks this is likely related to the helmet he has to wear at work.  Denies any significant pain, no systemic symptoms. On exam, there are a few subcutaneous nodules noted, no significant tenderness to palpation.  No current discharge or drainage. Suspect underlying cyst.  We reviewed considerations and management options including referral to dermatology.  Doubt abscess given signs and symptoms.  Can continue with monitoring.  If patient would like to proceed with referral to dermatology, can do so at any time.

## 2023-09-10 NOTE — Progress Notes (Signed)
    Procedures performed today:    None.  Independent interpretation of notes and tests performed by another provider:   None.  Brief History, Exam, Impression, and Recommendations:    BP (!) 110/96 (BP Location: Left Arm, Patient Position: Sitting, Cuff Size: Normal)   Pulse 80   Ht 5' 4 (1.626 m)   Wt 141 lb (64 kg)   SpO2 99%   BMI 24.20 kg/m   Primary hypertension Assessment & Plan: Blood pressure borderline in office today.  Continues with atenolol -chlorthalidone , however did stop amlodipine  as this was causing some drowsiness.  No current issues with chest pain, headaches, vision changes. Not checking blood pressure regularly at home. We discussed options and patient would like to resume taking amlodipine , however will take this in the evening given observed drowsiness Recommend intermittent monitoring of blood pressure at home, DASH diet  Orders: -     amLODIPine  Besylate; Take 1 tablet (5 mg total) by mouth daily.  Dispense: 90 tablet; Refill: 2 -     Atenolol -Chlorthalidone ; Take 1 tablet by mouth daily.  Dispense: 90 tablet; Refill: 1  Rash Assessment & Plan: Still has intermittent rash, likely caused by reaction to belt buckle.  Requesting refill of triamcinolone  to use as needed, refill sent to pharmacy   Subcutaneous cyst Assessment & Plan: Patient reports having small bumps over her posterior scalp.  Thinks this is likely related to the helmet he has to wear at work.  Denies any significant pain, no systemic symptoms. On exam, there are a few subcutaneous nodules noted, no significant tenderness to palpation.  No current discharge or drainage. Suspect underlying cyst.  We reviewed considerations and management options including referral to dermatology.  Doubt abscess given signs and symptoms.  Can continue with monitoring.  If patient would like to proceed with referral to dermatology, can do so at any time.   Tobacco use disorder Assessment &  Plan: Previously we will look to start Chantix , however patient has not done so as of yet.  He is still interested in starting this medication and does have it available.  We will follow-up on progress and response to medication if patient does elect to proceed with initiation   Wellness examination -     Comprehensive metabolic panel with GFR; Future -     CBC with Differential/Platelet; Future -     Hemoglobin A1c; Future -     Lipid panel; Future -     TSH Rfx on Abnormal to Free T4; Future  Other orders -     Triamcinolone  Acetonide; Apply 1 Application topically 2 (two) times daily.  Dispense: 30 g; Refill: 0  Return in about 4 months (around 01/11/2024) for CPE with fasting labs 1 week prior.   ___________________________________________ Jeet Shough de [place], MD, ABFM, [facility] Primary Care and Sports Medicine [facility]

## 2023-09-10 NOTE — Assessment & Plan Note (Signed)
 Blood pressure borderline in office today.  Continues with atenolol -chlorthalidone , however did stop amlodipine  as this was causing some drowsiness.  No current issues with chest pain, headaches, vision changes. Not checking blood pressure regularly at home. We discussed options and patient would like to resume taking amlodipine , however will take this in the evening given observed drowsiness Recommend intermittent monitoring of blood pressure at home, DASH diet

## 2023-09-10 NOTE — Assessment & Plan Note (Signed)
 Previously we will look to start Chantix , however patient has not done so as of yet.  He is still interested in starting this medication and does have it available.  We will follow-up on progress and response to medication if patient does elect to proceed with initiation

## 2023-09-10 NOTE — Assessment & Plan Note (Signed)
 Still has intermittent rash, likely caused by reaction to belt buckle.  Requesting refill of triamcinolone  to use as needed, refill sent to pharmacy

## 2024-01-21 ENCOUNTER — Other Ambulatory Visit (HOSPITAL_BASED_OUTPATIENT_CLINIC_OR_DEPARTMENT_OTHER): Payer: Self-pay | Admitting: Family Medicine

## 2024-01-22 ENCOUNTER — Encounter (HOSPITAL_BASED_OUTPATIENT_CLINIC_OR_DEPARTMENT_OTHER): Payer: Self-pay | Admitting: Family Medicine

## 2024-01-22 ENCOUNTER — Ambulatory Visit (INDEPENDENT_AMBULATORY_CARE_PROVIDER_SITE_OTHER): Admitting: Family Medicine

## 2024-01-22 VITALS — BP 116/79 | HR 69 | Ht 64.0 in | Wt 138.1 lb

## 2024-01-22 DIAGNOSIS — Z23 Encounter for immunization: Secondary | ICD-10-CM

## 2024-01-22 DIAGNOSIS — Z0000 Encounter for general adult medical examination without abnormal findings: Secondary | ICD-10-CM

## 2024-01-22 NOTE — Progress Notes (Signed)
 Subjective:    CC: Annual Physical Exam  HPI:  Nathan Benson is a 58 y.o. presenting for annual physical  I reviewed the past medical history, family history, social history, surgical history, and allergies today and no changes were needed.  Please see the problem list section below in epic for further details.  Past Medical History: Past Medical History:  Diagnosis Date   Hypertension    Past Surgical History: No past surgical history on file. Social History: Social History   Socioeconomic History   Marital status: Single    Spouse name: Not on file   Number of children: Not on file   Years of education: Not on file   Highest education level: Associate degree: occupational, scientist, product/process development, or vocational program  Occupational History   Not on file  Tobacco Use   Smoking status: Every Day    Current packs/day: 0.50    Types: Cigarettes    Passive exposure: Current   Smokeless tobacco: Never  Vaping Use   Vaping status: Never Used  Substance and Sexual Activity   Alcohol use: No   Drug use: No   Sexual activity: Not on file  Other Topics Concern   Not on file  Social History Narrative   Not on file   Social Drivers of Health   Financial Resource Strain: Medium Risk (02/05/2023)   Overall Financial Resource Strain (CARDIA)    Difficulty of Paying Living Expenses: Somewhat hard  Food Insecurity: Food Insecurity Present (02/05/2023)   Hunger Vital Sign    Worried About Running Out of Food in the Last Year: Sometimes true    Ran Out of Food in the Last Year: Sometimes true  Transportation Needs: No Transportation Needs (02/05/2023)   PRAPARE - Administrator, Civil Service (Medical): No    Lack of Transportation (Non-Medical): No  Physical Activity: Sufficiently Active (02/05/2023)   Exercise Vital Sign    Days of Exercise per Week: 4 days    Minutes of Exercise per Session: 120 min  Stress: No Stress Concern Present (02/05/2023)   [employer]  of Occupational Health - Occupational Stress Questionnaire    Feeling of Stress : Only a little  Social Connections: Moderately Isolated (02/05/2023)   Social Connection and Isolation Panel    Frequency of Communication with Friends and Family: Three times a week    Frequency of Social Gatherings with Friends and Family: More than three times a week    Attends Religious Services: More than 4 times per year    Active Member of [employer] or Organizations: No    Attends Engineer, Structural: Not on file    Marital Status: Never married   Family History: No family history on file. Allergies: No Known Allergies Medications: See med rec.  Review of Systems: No headache, visual changes, nausea, vomiting, diarrhea, constipation, dizziness, abdominal pain, skin rash, fevers, chills, night sweats, swollen lymph nodes, weight loss, chest pain, body aches, joint swelling, muscle aches, shortness of breath, mood changes, visual or auditory hallucinations.  Objective:    BP 116/79   Pulse 69   Ht 5' 4 (1.626 m)   Wt 138 lb 1.6 oz (62.6 kg)   SpO2 93%   BMI 23.70 kg/m   General: Well Developed, well nourished, and in no acute distress.  Neuro: Alert and oriented x3, extra-ocular muscles intact, sensation grossly intact. Cranial nerves II through XII are intact, motor, sensory, and coordinative functions are all intact. HEENT: Normocephalic, atraumatic, pupils  equal round reactive to light, neck supple, no masses, no lymphadenopathy, thyroid nonpalpable. Oropharynx, nasopharynx, external ear canals are unremarkable. Skin: Warm and dry, no rashes noted.  Cardiac: Regular rate and rhythm, no murmurs rubs or gallops. Respiratory: Clear to auscultation bilaterally. Not using accessory muscles, speaking in full sentences. Abdominal: Soft, nontender, nondistended, positive bowel sounds, no masses, no organomegaly. Musculoskeletal: Shoulder, elbow, wrist, hip, knee, ankle stable, and with full  range of motion.  Impression and Recommendations:    Wellness examination Assessment & Plan: Routine HCM labs ordered. HCM reviewed/discussed. Anticipatory guidance regarding healthy weight, lifestyle and choices given. Recommend healthy diet.  Recommend approximately 150 minutes/week of moderate intensity exercise Recommend regular dental and vision exams Always use seatbelt/lap and shoulder restraints Recommend using smoke alarms and checking batteries at least twice a year Recommend using sunscreen when outside Discussed colon cancer screening recommendations, options.  Patient UTD - due 2027 Discussed recommendations for shingles vaccine.  Patient will look to do this Discussed immunization recommendations  Orders: -     TSH Rfx on Abnormal to Free T4 -     Lipid panel -     Hemoglobin A1c -     CBC with Differential/Platelet -     Comprehensive metabolic panel with GFR  Immunization due -     Pneumococcal Conjugate PCV21(Capvaxive)  Return in about 4 months (around 05/21/2024) for hypertension.   ___________________________________________ Nathan Gerwig de [place], MD, ABFM, CAQSM Primary Care and Sports [facility]

## 2024-01-22 NOTE — Assessment & Plan Note (Signed)
 Routine HCM labs ordered. HCM reviewed/discussed. Anticipatory guidance regarding healthy weight, lifestyle and choices given. Recommend healthy diet.  Recommend approximately 150 minutes/week of moderate intensity exercise Recommend regular dental and vision exams Always use seatbelt/lap and shoulder restraints Recommend using smoke alarms and checking batteries at least twice a year Recommend using sunscreen when outside Discussed colon cancer screening recommendations, options.  Patient UTD - due 2027 Discussed recommendations for shingles vaccine.  Patient will look to do this Discussed immunization recommendations

## 2024-01-22 NOTE — Patient Instructions (Signed)
  Medication Instructions:  Your physician recommends that you continue on your current medications as directed. Please refer to the Current Medication list given to you today. --If you need a refill on any your medications before your next appointment, please call your pharmacy first. If no refills are authorized on file call the office.-- Lab Work: Your physician has recommended that you have lab work today: Yes, completed at visit If you have labs (blood work) drawn today and your tests are completely normal, you will receive your results via MyChart message OR a phone call from our staff.  Please ensure you check your voicemail in the event that you authorized detailed messages to be left on a delegated number. If you have any lab test that is abnormal or we need to change your treatment, we will call you to review the results.  Referrals/Procedures/Imaging: none   Follow-Up: Your next appointment:   Your physician recommends that you schedule a follow-up appointment in: 4-6 months with Dr. de [place]  You will receive a text message or e-mail with a link to a survey about your care and experience with us  today! We would greatly appreciate your feedback!   Thanks for letting us  be apart of your health journey!!  Primary Care and Sports Medicine   Dr. Quintin sheerer [place]   We encourage you to activate your patient portal called MyChart.  Sign up information is provided on this After Visit Summary.  MyChart is used to connect with patients for Virtual Visits (Telemedicine).  Patients are able to view lab/test results, encounter notes, upcoming appointments, etc.  Non-urgent messages can be sent to your provider as well. To learn more about what you can do with MyChart, please visit --  [email or web address].

## 2024-01-23 LAB — COMPREHENSIVE METABOLIC PANEL WITH GFR
ALT: 30 IU/L (ref 0–44)
AST: 34 IU/L (ref 0–40)
Albumin: 4.7 g/dL (ref 3.8–4.9)
Alkaline Phosphatase: 70 IU/L (ref 47–123)
BUN/Creatinine Ratio: 14 (ref 9–20)
BUN: 15 mg/dL (ref 6–24)
Bilirubin Total: 0.7 mg/dL (ref 0.0–1.2)
CO2: 25 mmol/L (ref 20–29)
Calcium: 10.1 mg/dL (ref 8.7–10.2)
Chloride: 97 mmol/L (ref 96–106)
Creatinine, Ser: 1.04 mg/dL (ref 0.76–1.27)
Globulin, Total: 2.6 g/dL (ref 1.5–4.5)
Glucose: 93 mg/dL (ref 70–99)
Potassium: 4 mmol/L (ref 3.5–5.2)
Sodium: 139 mmol/L (ref 134–144)
Total Protein: 7.3 g/dL (ref 6.0–8.5)
eGFR: 84 mL/min/1.73 (ref >59)

## 2024-01-23 LAB — LIPID PANEL
Chol/HDL Ratio: 3.1 ratio (ref 0.0–5.0)
Cholesterol, Total: 203 mg/dL — ABNORMAL HIGH (ref 100–199)
HDL: 66 mg/dL (ref >39)
LDL Chol Calc (NIH): 109 mg/dL — ABNORMAL HIGH (ref 0–99)
Triglycerides: 160 mg/dL — ABNORMAL HIGH (ref 0–149)
VLDL Cholesterol Cal: 28 mg/dL (ref 5–40)

## 2024-01-23 LAB — CBC WITH DIFFERENTIAL/PLATELET
Basophils Absolute: 0.1 x10E3/uL (ref 0.0–0.2)
Basos: 1 %
EOS (ABSOLUTE): 0.1 x10E3/uL (ref 0.0–0.4)
Eos: 1 %
Hematocrit: 47.1 % (ref 37.5–51.0)
Hemoglobin: 15.8 g/dL (ref 13.0–17.7)
Immature Grans (Abs): 0 x10E3/uL (ref 0.0–0.1)
Immature Granulocytes: 0 %
Lymphocytes Absolute: 2.6 x10E3/uL (ref 0.7–3.1)
Lymphs: 42 %
MCH: 28.2 pg (ref 26.6–33.0)
MCHC: 33.5 g/dL (ref 31.5–35.7)
MCV: 84 fL (ref 79–97)
Monocytes Absolute: 0.5 x10E3/uL (ref 0.1–0.9)
Monocytes: 9 %
Neutrophils Absolute: 2.9 x10E3/uL (ref 1.4–7.0)
Neutrophils: 47 %
Platelets: 299 x10E3/uL (ref 150–450)
RBC: 5.61 x10E6/uL (ref 4.14–5.80)
RDW: 12.6 % (ref 11.6–15.4)
WBC: 6.2 x10E3/uL (ref 3.4–10.8)

## 2024-01-23 LAB — HEMOGLOBIN A1C
Est. average glucose Bld gHb Est-mCnc: 120 mg/dL
Hgb A1c MFr Bld: 5.8 % — ABNORMAL HIGH (ref 4.8–5.6)

## 2024-01-23 LAB — TSH RFX ON ABNORMAL TO FREE T4: TSH: 0.998 u[IU]/mL (ref 0.450–4.500)

## 2024-01-24 ENCOUNTER — Ambulatory Visit (HOSPITAL_BASED_OUTPATIENT_CLINIC_OR_DEPARTMENT_OTHER): Payer: Self-pay | Admitting: Family Medicine

## 2024-05-21 ENCOUNTER — Ambulatory Visit (HOSPITAL_BASED_OUTPATIENT_CLINIC_OR_DEPARTMENT_OTHER): Admitting: Family Medicine
